# Patient Record
Sex: Female | Born: 1950 | Race: White | Hispanic: No | Smoking: Former smoker
Health system: Southern US, Community
[De-identification: ages and names within clinical notes are randomized; demographics above are authoritative.]

## PROBLEM LIST (undated history)

## (undated) DIAGNOSIS — F329 Major depressive disorder, single episode, unspecified: Secondary | ICD-10-CM

## (undated) DIAGNOSIS — B009 Herpesviral infection, unspecified: Secondary | ICD-10-CM

## (undated) DIAGNOSIS — I10 Essential (primary) hypertension: Secondary | ICD-10-CM

## (undated) DIAGNOSIS — G8929 Other chronic pain: Secondary | ICD-10-CM

## (undated) DIAGNOSIS — R29898 Other symptoms and signs involving the musculoskeletal system: Secondary | ICD-10-CM

## (undated) DIAGNOSIS — R51 Headache: Secondary | ICD-10-CM

## (undated) DIAGNOSIS — I6529 Occlusion and stenosis of unspecified carotid artery: Secondary | ICD-10-CM

## (undated) DIAGNOSIS — F32A Depression, unspecified: Secondary | ICD-10-CM

## (undated) DIAGNOSIS — E039 Hypothyroidism, unspecified: Secondary | ICD-10-CM

## (undated) DIAGNOSIS — M1990 Unspecified osteoarthritis, unspecified site: Secondary | ICD-10-CM

## (undated) DIAGNOSIS — G729 Myopathy, unspecified: Secondary | ICD-10-CM

## (undated) DIAGNOSIS — N200 Calculus of kidney: Secondary | ICD-10-CM

## (undated) HISTORY — DX: Headache: R51

## (undated) HISTORY — PX: APPENDECTOMY: SHX54

## (undated) HISTORY — DX: Occlusion and stenosis of unspecified carotid artery: I65.29

## (undated) HISTORY — PX: COLONOSCOPY: SHX174

## (undated) HISTORY — DX: Other chronic pain: G89.29

## (undated) HISTORY — PX: OTHER SURGICAL HISTORY: SHX169

## (undated) HISTORY — DX: Essential (primary) hypertension: I10

## (undated) HISTORY — PX: REFRACTIVE SURGERY: SHX103

## (undated) HISTORY — PX: TONSILLECTOMY AND ADENOIDECTOMY: SHX28

## (undated) HISTORY — DX: Other symptoms and signs involving the musculoskeletal system: R29.898

---

## 1972-03-15 DIAGNOSIS — N200 Calculus of kidney: Secondary | ICD-10-CM

## 1972-03-15 HISTORY — DX: Calculus of kidney: N20.0

## 1998-12-05 ENCOUNTER — Other Ambulatory Visit: Admission: RE | Admit: 1998-12-05 | Discharge: 1998-12-05 | Payer: Self-pay | Admitting: Obstetrics and Gynecology

## 1999-12-03 ENCOUNTER — Other Ambulatory Visit: Admission: RE | Admit: 1999-12-03 | Discharge: 1999-12-03 | Payer: Self-pay | Admitting: Obstetrics and Gynecology

## 1999-12-08 ENCOUNTER — Encounter: Payer: Self-pay | Admitting: Obstetrics and Gynecology

## 1999-12-08 ENCOUNTER — Encounter: Admission: RE | Admit: 1999-12-08 | Discharge: 1999-12-08 | Payer: Self-pay | Admitting: Obstetrics and Gynecology

## 2004-01-03 ENCOUNTER — Encounter: Admission: RE | Admit: 2004-01-03 | Discharge: 2004-01-03 | Payer: Self-pay | Admitting: *Deleted

## 2007-11-15 ENCOUNTER — Other Ambulatory Visit: Admission: RE | Admit: 2007-11-15 | Discharge: 2007-11-15 | Payer: Self-pay | Admitting: Family Medicine

## 2007-11-29 ENCOUNTER — Encounter: Admission: RE | Admit: 2007-11-29 | Discharge: 2007-11-29 | Payer: Self-pay | Admitting: Family Medicine

## 2008-12-02 ENCOUNTER — Encounter: Admission: RE | Admit: 2008-12-02 | Discharge: 2008-12-02 | Payer: Self-pay | Admitting: Family Medicine

## 2009-12-18 ENCOUNTER — Ambulatory Visit (HOSPITAL_BASED_OUTPATIENT_CLINIC_OR_DEPARTMENT_OTHER): Payer: PRIVATE HEALTH INSURANCE | Admitting: Oncology

## 2009-12-30 LAB — COMPREHENSIVE METABOLIC PANEL
Albumin: 4.5 g/dL (ref 3.5–5.2)
Alkaline Phosphatase: 79 U/L (ref 39–117)
BUN: 21 mg/dL (ref 6–23)
Calcium: 9.8 mg/dL (ref 8.4–10.5)
Chloride: 102 mEq/L (ref 96–112)
Glucose, Bld: 125 mg/dL — ABNORMAL HIGH (ref 70–99)
Potassium: 3.6 mEq/L (ref 3.5–5.3)
Sodium: 140 mEq/L (ref 135–145)
Total Protein: 6.9 g/dL (ref 6.0–8.3)

## 2009-12-30 LAB — CBC WITH DIFFERENTIAL/PLATELET
Basophils Absolute: 0 10*3/uL (ref 0.0–0.1)
Eosinophils Absolute: 0.2 10*3/uL (ref 0.0–0.5)
HGB: 13.9 g/dL (ref 11.6–15.9)
MCV: 95.8 fL (ref 79.5–101.0)
MONO#: 0.4 10*3/uL (ref 0.1–0.9)
MONO%: 8.3 % (ref 0.0–14.0)
NEUT#: 2.7 10*3/uL (ref 1.5–6.5)
RBC: 4.14 10*6/uL (ref 3.70–5.45)
RDW: 13.3 % (ref 11.2–14.5)
WBC: 5 10*3/uL (ref 3.9–10.3)
lymph#: 1.7 10*3/uL (ref 0.9–3.3)

## 2009-12-30 LAB — GAMMA GT: GGT: 40 U/L (ref 7–51)

## 2009-12-30 LAB — IRON AND TIBC
%SAT: 31 % (ref 20–55)
TIBC: 334 ug/dL (ref 250–470)

## 2010-01-22 ENCOUNTER — Encounter: Admission: RE | Admit: 2010-01-22 | Discharge: 2010-01-22 | Payer: Self-pay | Admitting: Family Medicine

## 2010-02-23 ENCOUNTER — Encounter
Admission: RE | Admit: 2010-02-23 | Discharge: 2010-02-23 | Payer: Self-pay | Source: Home / Self Care | Attending: Family Medicine | Admitting: Family Medicine

## 2010-05-05 ENCOUNTER — Encounter (HOSPITAL_BASED_OUTPATIENT_CLINIC_OR_DEPARTMENT_OTHER): Payer: PRIVATE HEALTH INSURANCE | Admitting: Oncology

## 2010-05-05 LAB — COMPREHENSIVE METABOLIC PANEL
Albumin: 4.6 g/dL (ref 3.5–5.2)
CO2: 31 mEq/L (ref 19–32)
Calcium: 10.2 mg/dL (ref 8.4–10.5)
Glucose, Bld: 116 mg/dL — ABNORMAL HIGH (ref 70–99)
Potassium: 3.4 mEq/L — ABNORMAL LOW (ref 3.5–5.3)
Sodium: 140 mEq/L (ref 135–145)
Total Protein: 6.7 g/dL (ref 6.0–8.3)

## 2010-05-05 LAB — CBC WITH DIFFERENTIAL/PLATELET
Basophils Absolute: 0 10*3/uL (ref 0.0–0.1)
Eosinophils Absolute: 0.3 10*3/uL (ref 0.0–0.5)
HCT: 43.5 % (ref 34.8–46.6)
HGB: 15.1 g/dL (ref 11.6–15.9)
MCV: 94.7 fL (ref 79.5–101.0)
NEUT#: 4.6 10*3/uL (ref 1.5–6.5)
NEUT%: 62.6 % (ref 38.4–76.8)
RDW: 14.1 % (ref 11.2–14.5)
lymph#: 1.9 10*3/uL (ref 0.9–3.3)

## 2010-05-05 LAB — IRON AND TIBC: Iron: 111 ug/dL (ref 42–145)

## 2010-05-05 LAB — FERRITIN: Ferritin: 342 ng/mL — ABNORMAL HIGH (ref 10–291)

## 2010-05-05 LAB — GAMMA GT: GGT: 48 U/L (ref 7–51)

## 2010-05-22 ENCOUNTER — Other Ambulatory Visit: Payer: Self-pay | Admitting: Family Medicine

## 2010-05-22 DIAGNOSIS — Z1231 Encounter for screening mammogram for malignant neoplasm of breast: Secondary | ICD-10-CM

## 2010-06-04 ENCOUNTER — Ambulatory Visit
Admission: RE | Admit: 2010-06-04 | Discharge: 2010-06-04 | Disposition: A | Discharge status: Home / Self Care | Payer: PRIVATE HEALTH INSURANCE | Source: Ambulatory Visit | Attending: Family Medicine | Admitting: Family Medicine

## 2010-06-04 DIAGNOSIS — Z1231 Encounter for screening mammogram for malignant neoplasm of breast: Secondary | ICD-10-CM

## 2010-09-10 ENCOUNTER — Ambulatory Visit: Payer: 59 | Attending: Family Medicine | Admitting: Physical Therapy

## 2010-09-10 DIAGNOSIS — IMO0001 Reserved for inherently not codable concepts without codable children: Secondary | ICD-10-CM | POA: Insufficient documentation

## 2010-09-10 DIAGNOSIS — M6281 Muscle weakness (generalized): Secondary | ICD-10-CM | POA: Insufficient documentation

## 2010-09-10 DIAGNOSIS — M2550 Pain in unspecified joint: Secondary | ICD-10-CM | POA: Insufficient documentation

## 2010-09-10 DIAGNOSIS — M2560 Stiffness of unspecified joint, not elsewhere classified: Secondary | ICD-10-CM | POA: Insufficient documentation

## 2010-09-14 ENCOUNTER — Ambulatory Visit: Payer: 59 | Attending: Family Medicine | Admitting: Physical Therapy

## 2010-09-14 DIAGNOSIS — M6281 Muscle weakness (generalized): Secondary | ICD-10-CM | POA: Insufficient documentation

## 2010-09-14 DIAGNOSIS — IMO0001 Reserved for inherently not codable concepts without codable children: Secondary | ICD-10-CM | POA: Insufficient documentation

## 2010-09-14 DIAGNOSIS — M2550 Pain in unspecified joint: Secondary | ICD-10-CM | POA: Insufficient documentation

## 2010-09-14 DIAGNOSIS — M2560 Stiffness of unspecified joint, not elsewhere classified: Secondary | ICD-10-CM | POA: Insufficient documentation

## 2010-09-17 ENCOUNTER — Ambulatory Visit: Payer: 59 | Admitting: Physical Therapy

## 2010-09-21 ENCOUNTER — Ambulatory Visit: Payer: 59 | Admitting: Physical Therapy

## 2010-09-23 ENCOUNTER — Ambulatory Visit: Payer: 59 | Admitting: Physical Therapy

## 2010-09-25 ENCOUNTER — Ambulatory Visit: Payer: 59 | Admitting: Physical Therapy

## 2010-09-28 ENCOUNTER — Ambulatory Visit: Payer: 59 | Admitting: Physical Therapy

## 2010-09-30 ENCOUNTER — Ambulatory Visit: Payer: 59 | Admitting: Physical Therapy

## 2010-10-02 ENCOUNTER — Ambulatory Visit: Payer: 59 | Admitting: Physical Therapy

## 2010-10-06 ENCOUNTER — Ambulatory Visit: Payer: 59 | Admitting: Physical Therapy

## 2010-10-08 ENCOUNTER — Encounter: Payer: PRIVATE HEALTH INSURANCE | Admitting: Physical Therapy

## 2010-10-09 ENCOUNTER — Ambulatory Visit: Payer: 59 | Admitting: Physical Therapy

## 2010-10-12 ENCOUNTER — Ambulatory Visit: Payer: 59 | Admitting: Physical Therapy

## 2010-10-14 ENCOUNTER — Ambulatory Visit: Payer: Medicare HMO | Attending: Family Medicine | Admitting: Physical Therapy

## 2010-10-14 DIAGNOSIS — M2560 Stiffness of unspecified joint, not elsewhere classified: Secondary | ICD-10-CM | POA: Insufficient documentation

## 2010-10-14 DIAGNOSIS — M2550 Pain in unspecified joint: Secondary | ICD-10-CM | POA: Insufficient documentation

## 2010-10-14 DIAGNOSIS — M6281 Muscle weakness (generalized): Secondary | ICD-10-CM | POA: Insufficient documentation

## 2010-10-14 DIAGNOSIS — IMO0001 Reserved for inherently not codable concepts without codable children: Secondary | ICD-10-CM | POA: Insufficient documentation

## 2010-10-16 ENCOUNTER — Encounter: Payer: PRIVATE HEALTH INSURANCE | Admitting: Physical Therapy

## 2010-10-19 ENCOUNTER — Ambulatory Visit: Payer: Medicare HMO | Admitting: Physical Therapy

## 2010-10-21 ENCOUNTER — Ambulatory Visit: Payer: Medicare HMO | Admitting: Physical Therapy

## 2010-10-23 ENCOUNTER — Encounter: Payer: PRIVATE HEALTH INSURANCE | Admitting: Physical Therapy

## 2010-10-26 ENCOUNTER — Ambulatory Visit: Payer: Medicare HMO | Admitting: Physical Therapy

## 2010-10-28 ENCOUNTER — Encounter: Payer: PRIVATE HEALTH INSURANCE | Admitting: Physical Therapy

## 2010-10-30 ENCOUNTER — Encounter: Payer: PRIVATE HEALTH INSURANCE | Admitting: Physical Therapy

## 2010-11-02 ENCOUNTER — Encounter: Payer: PRIVATE HEALTH INSURANCE | Admitting: Physical Therapy

## 2010-11-04 ENCOUNTER — Ambulatory Visit: Payer: Medicare HMO | Admitting: Physical Therapy

## 2010-11-06 ENCOUNTER — Encounter: Payer: PRIVATE HEALTH INSURANCE | Admitting: Physical Therapy

## 2010-12-31 ENCOUNTER — Encounter: Payer: Self-pay | Admitting: Gastroenterology

## 2011-01-05 ENCOUNTER — Ambulatory Visit (AMBULATORY_SURGERY_CENTER): Payer: PRIVATE HEALTH INSURANCE

## 2011-01-05 VITALS — Ht 67.0 in | Wt 174.3 lb

## 2011-01-05 DIAGNOSIS — Z1211 Encounter for screening for malignant neoplasm of colon: Secondary | ICD-10-CM

## 2011-01-05 MED ORDER — PEG-KCL-NACL-NASULF-NA ASC-C 100 G PO SOLR: 1.0000 | Freq: Once | ORAL | Status: AC

## 2011-01-05 NOTE — Progress Notes (Signed)
Pt came into the office today for her pre-visit prior to her colonoscopy with Dr Russella Dar on 01/19/11. Pt states she had a colonoscopy 10 years ago with a Dr Rogue Bussing in [place] (which was normal). A medical release form was filled out and was given to [facility]. Ulis Rias RN

## 2011-01-19 ENCOUNTER — Encounter: Payer: Self-pay | Admitting: Gastroenterology

## 2011-01-19 ENCOUNTER — Ambulatory Visit (AMBULATORY_SURGERY_CENTER): Payer: PRIVATE HEALTH INSURANCE | Admitting: Gastroenterology

## 2011-01-19 DIAGNOSIS — Z1211 Encounter for screening for malignant neoplasm of colon: Secondary | ICD-10-CM

## 2011-01-19 DIAGNOSIS — D126 Benign neoplasm of colon, unspecified: Secondary | ICD-10-CM

## 2011-01-19 MED ORDER — SODIUM CHLORIDE 0.9 % IV SOLN: 500.0000 mL | INTRAVENOUS | Status: DC

## 2011-01-19 NOTE — Patient Instructions (Signed)
Please refer to the blue and neon green sheets for instructions regarding diet and activity for the rest of today.  Handout on polyps given. Resume previous medications. 

## 2011-01-20 ENCOUNTER — Telehealth: Payer: Self-pay

## 2011-01-20 NOTE — Telephone Encounter (Signed)
Follow up Call- Patient questions:  Do you have a fever, pain , or abdominal swelling? no Pain Score  0 *  Have you tolerated food without any problems? no  Have you been able to return to your normal activities? yes  Do you have any questions about your discharge instructions: Diet   no Medications  no Follow up visit  no  Do you have questions or concerns about your Care? no  Actions: * If pain score is 4 or above: No action needed, pain <4.  Pt is on her way to work.  Pt asked how long it will be before she has a bowel movement.  I advised her it may take a few days.  She has to get back to eating.  Pt to call if has not had BM within 3 days. maw

## 2011-01-25 ENCOUNTER — Encounter: Payer: Self-pay | Admitting: Gastroenterology

## 2011-08-12 ENCOUNTER — Other Ambulatory Visit: Payer: Self-pay | Admitting: Family Medicine

## 2011-08-12 DIAGNOSIS — R7989 Other specified abnormal findings of blood chemistry: Secondary | ICD-10-CM

## 2011-08-16 ENCOUNTER — Other Ambulatory Visit: Payer: PRIVATE HEALTH INSURANCE

## 2011-08-17 ENCOUNTER — Ambulatory Visit
Admission: RE | Admit: 2011-08-17 | Discharge: 2011-08-17 | Disposition: A | Discharge status: Home / Self Care | Payer: PRIVATE HEALTH INSURANCE | Source: Ambulatory Visit | Attending: Family Medicine | Admitting: Family Medicine

## 2011-08-17 DIAGNOSIS — R7989 Other specified abnormal findings of blood chemistry: Secondary | ICD-10-CM

## 2011-08-26 ENCOUNTER — Encounter (HOSPITAL_COMMUNITY): Payer: Self-pay | Admitting: Dietician

## 2011-08-26 NOTE — Progress Notes (Signed)
[facility] Diabetes Class Completion  Date:August 26, 2011  Time: 6:30 PM  Pt attended [facility]'s Diabetes Class on August 26, 2011.   Patient was educated on the following topics: carbohydrate metabolism in relation to diabetes, sources of carbohydrate, carbohydrate counting, meal planning strategies, food label reading, and portion control.   Sadie Pickar A. Kayan, RD, LDN Date:August 26, 2011 Time: 6:30 PM 

## 2011-11-09 ENCOUNTER — Other Ambulatory Visit: Payer: Self-pay | Admitting: Family Medicine

## 2011-11-09 DIAGNOSIS — IMO0002 Reserved for concepts with insufficient information to code with codable children: Secondary | ICD-10-CM

## 2011-11-09 DIAGNOSIS — R29890 Loss of height: Secondary | ICD-10-CM

## 2011-11-11 ENCOUNTER — Ambulatory Visit
Admission: RE | Admit: 2011-11-11 | Discharge: 2011-11-11 | Disposition: A | Discharge status: Home / Self Care | Payer: PRIVATE HEALTH INSURANCE | Source: Ambulatory Visit | Attending: Family Medicine | Admitting: Family Medicine

## 2011-11-11 DIAGNOSIS — R29890 Loss of height: Secondary | ICD-10-CM

## 2011-11-11 DIAGNOSIS — IMO0002 Reserved for concepts with insufficient information to code with codable children: Secondary | ICD-10-CM

## 2012-01-10 ENCOUNTER — Other Ambulatory Visit: Payer: Self-pay

## 2012-04-24 ENCOUNTER — Ambulatory Visit
Admission: RE | Admit: 2012-04-24 | Discharge: 2012-04-24 | Disposition: A | Discharge status: Home / Self Care | Payer: PRIVATE HEALTH INSURANCE | Source: Ambulatory Visit | Attending: Family Medicine | Admitting: Family Medicine

## 2012-04-24 ENCOUNTER — Other Ambulatory Visit: Payer: Self-pay | Admitting: Family Medicine

## 2012-04-24 DIAGNOSIS — G8929 Other chronic pain: Secondary | ICD-10-CM

## 2012-04-24 DIAGNOSIS — Z139 Encounter for screening, unspecified: Secondary | ICD-10-CM

## 2012-04-24 DIAGNOSIS — M25552 Pain in left hip: Secondary | ICD-10-CM

## 2012-04-28 ENCOUNTER — Other Ambulatory Visit (INDEPENDENT_AMBULATORY_CARE_PROVIDER_SITE_OTHER): Payer: PRIVATE HEALTH INSURANCE | Admitting: *Deleted

## 2012-04-28 ENCOUNTER — Other Ambulatory Visit: Payer: PRIVATE HEALTH INSURANCE

## 2012-04-28 DIAGNOSIS — R0989 Other specified symptoms and signs involving the circulatory and respiratory systems: Secondary | ICD-10-CM

## 2012-05-01 ENCOUNTER — Ambulatory Visit
Admission: RE | Admit: 2012-05-01 | Discharge: 2012-05-01 | Disposition: A | Discharge status: Home / Self Care | Payer: PRIVATE HEALTH INSURANCE | Source: Ambulatory Visit | Attending: Family Medicine | Admitting: Family Medicine

## 2012-05-01 DIAGNOSIS — Z139 Encounter for screening, unspecified: Secondary | ICD-10-CM

## 2012-11-30 ENCOUNTER — Other Ambulatory Visit (INDEPENDENT_AMBULATORY_CARE_PROVIDER_SITE_OTHER): Payer: PRIVATE HEALTH INSURANCE | Admitting: *Deleted

## 2012-11-30 DIAGNOSIS — R0989 Other specified symptoms and signs involving the circulatory and respiratory systems: Secondary | ICD-10-CM

## 2013-01-18 ENCOUNTER — Other Ambulatory Visit: Payer: Self-pay

## 2013-04-12 ENCOUNTER — Other Ambulatory Visit: Payer: Self-pay

## 2013-05-09 ENCOUNTER — Other Ambulatory Visit: Payer: Self-pay | Admitting: Family Medicine

## 2013-05-09 DIAGNOSIS — Z09 Encounter for follow-up examination after completed treatment for conditions other than malignant neoplasm: Secondary | ICD-10-CM

## 2013-06-06 ENCOUNTER — Other Ambulatory Visit: Payer: PRIVATE HEALTH INSURANCE

## 2013-06-06 ENCOUNTER — Other Ambulatory Visit (HOSPITAL_COMMUNITY): Payer: PRIVATE HEALTH INSURANCE

## 2013-06-29 ENCOUNTER — Other Ambulatory Visit (HOSPITAL_COMMUNITY): Payer: Self-pay | Admitting: Family Medicine

## 2013-06-29 ENCOUNTER — Ambulatory Visit (HOSPITAL_COMMUNITY)
Admission: RE | Admit: 2013-06-29 | Discharge: 2013-06-29 | Disposition: A | Discharge status: Home / Self Care | Payer: 59 | Source: Ambulatory Visit | Attending: Vascular Surgery | Admitting: Vascular Surgery

## 2013-06-29 ENCOUNTER — Ambulatory Visit
Admission: RE | Admit: 2013-06-29 | Discharge: 2013-06-29 | Disposition: A | Discharge status: Home / Self Care | Payer: 59 | Source: Ambulatory Visit | Attending: Family Medicine | Admitting: Family Medicine

## 2013-06-29 DIAGNOSIS — R0989 Other specified symptoms and signs involving the circulatory and respiratory systems: Secondary | ICD-10-CM

## 2013-06-29 DIAGNOSIS — Z09 Encounter for follow-up examination after completed treatment for conditions other than malignant neoplasm: Secondary | ICD-10-CM

## 2013-06-29 DIAGNOSIS — I6529 Occlusion and stenosis of unspecified carotid artery: Secondary | ICD-10-CM

## 2013-07-02 ENCOUNTER — Encounter (HOSPITAL_COMMUNITY): Payer: Self-pay | Admitting: Pharmacy Technician

## 2013-07-02 ENCOUNTER — Encounter: Payer: Self-pay | Admitting: Surgery

## 2013-07-02 ENCOUNTER — Ambulatory Visit (INDEPENDENT_AMBULATORY_CARE_PROVIDER_SITE_OTHER): Payer: PRIVATE HEALTH INSURANCE | Admitting: Surgery

## 2013-07-02 ENCOUNTER — Other Ambulatory Visit: Payer: Self-pay

## 2013-07-02 VITALS — BP 132/76 | HR 65 | Ht 67.0 in | Wt 157.0 lb

## 2013-07-02 DIAGNOSIS — I6529 Occlusion and stenosis of unspecified carotid artery: Secondary | ICD-10-CM

## 2013-07-02 NOTE — Pre-Procedure Instructions (Signed)
Anne Warner  07/02/2013   Your procedure is scheduled on:  07/05/13  Report to [facility]  2 * 3 at 530 AM.  Call this number if you have problems the morning of surgery: [phone]   Remember:   Do not eat food or drink liquids after midnight.   Take these medicines the morning of surgery with A SIP OF WATER: xanax,hygroton,synthroid   Do not wear jewelry, make-up or nail polish.  Do not wear lotions, powders, or perfumes. You may wear deodorant.  Do not shave 48 hours prior to surgery. Men may shave face and neck.  Do not bring valuables to the hospital.  [facility] is not responsible                  for any belongings or valuables.               Contacts, dentures or bridgework may not be worn into surgery.  Leave suitcase in the car. After surgery it may be brought to your room.  For patients admitted to the hospital, discharge time is determined by your                treatment team.               Patients discharged the day of surgery will not be allowed to drive  home.  Name and phone number of your driver: family  Special Instructions: Shower using CHG 2 nights before surgery and the night before surgery.  If you shower the day of surgery use CHG.  Use special wash - you have one bottle of CHG for all showers.  You should use approximately 1/3 of the bottle for each shower.   Please read over the following fact sheets that you were given: Pain Booklet, Coughing and Deep Breathing, Blood Transfusion Information, MRSA Information and Surgical Site Infection Prevention

## 2013-07-02 NOTE — Progress Notes (Signed)
Patient name: Anne Warner MRN: [id number] DOB: 1950-09-15 Sex: female   Referred by: Dr. Sandi Mariscal  Reason for referral:  Chief Complaint  Patient presents with  . Carotid    new pt labs done on 06-29-13    HISTORY OF PRESENT ILLNESS: This is a very pleasant 63 year old female who has been followed for asymptomatic right carotid stenosis.  She denies numbness or weakness in either extremity.  She denies slurred speech.  She denies amaurosis fugax.  She recently had an episode of dizziness that was felt to be related to low blood pressure.  The patient has a history of having her filed or have a carotid endarterectomy in the setting of stroke which was complicated by death.  This was in 07-02-80.  Therefore, she is somewhat apprehensive.  The patient is medically managed for hypertension with 2 medications, including a ARB.  She has hypercholesterolemia but is not taking a statin secondary to muscle weakness.  She is a former smoker.  Past Medical History  Diagnosis Date  . Bilateral leg pain   . Thyroid disease   . Hypertension   . Chronic headaches   . Left leg weakness     Past Surgical History  Procedure Laterality Date  . Colonoscopy    . Appendectomy    . Tonsillectomy and adenoidectomy    . Broken arm       right arm/ with pins    History   Social History  . Marital Status: Divorced    Spouse Name: N/A    Number of Children: N/A  . Years of Education: N/A   Occupational History  . Not on file.   Social History Main Topics  . Smoking status: Former Smoker    Types: Cigarettes    Quit date: 03/16/2007  . Smokeless tobacco: Never Used     Comment: using e cigs  . Alcohol Use: 3.5 oz/week    7 drink(s) per week  . Drug Use: No  . Sexual Activity: Not on file   Other Topics Concern  . Not on file   Social History Narrative  . No narrative on file    Family History  Problem Relation Age of Onset  . Hypertension Mother   . Varicose Veins Mother   .  Diabetes Father   . Hyperlipidemia Father   . Diabetes Brother   . Hypertension Brother     Allergies as of 07/02/2013  . (No Known Allergies)    Current Outpatient Prescriptions on File Prior to Visit  Medication Sig Dispense Refill  . ALPRAZolam (XANAX) 0.5 MG tablet Take 0.5 mg by mouth daily. Take 1/2 tab daily       . celecoxib (CELEBREX) 200 MG capsule Take 200 mg by mouth daily.        . chlorthalidone (HYGROTON) 25 MG tablet Take 12.5 mg by mouth daily.       Marland Kitchen levothyroxine (SYNTHROID, LEVOTHROID) 88 MCG tablet Take 88 mcg by mouth daily.        . cyclobenzaprine (FLEXERIL) 5 MG tablet Take 5 mg by mouth 3 (three) times daily as needed.        . potassium chloride (KLOR-CON) 10 MEQ CR tablet Take 10 mEq by mouth daily. Take 2 tabs daily       . varenicline (CHANTIX) 1 MG tablet Take 1 mg by mouth 2 (two) times daily.         No current facility-administered medications on file prior to  visit.     REVIEW OF SYSTEMS: Cardiovascular: No chest pain, chest pressure, palpitations.  Positive for shortness of breath with exertion, leg pain and varicose vein Pulmonary: No productive cough, asthma or wheezing. Neurologic: No weakness, paresthesias, aphasia, or amaurosis. No dizziness. Hematologic: No bleeding problems or clotting disorders. Musculoskeletal: No joint pain or joint swelling. Gastrointestinal: No blood in stool or hematemesis Genitourinary: No dysuria or hematuria. Psychiatric:: No history of major depression. Integumentary: No rashes or ulcers. Constitutional: No fever or chills.  PHYSICAL EXAMINATION: General: The patient appears their stated age.  Vital signs are BP 132/76  Pulse 65  Ht 5\' 7"  (1.702 m)  Wt 157 lb (71.215 kg)  BMI 24.58 kg/m2  SpO2 100% HEENT:  No gross abnormalities Pulmonary: Respirations are non-labored Abdomen: Soft and non-tender  Musculoskeletal: There are no major deformities.   Neurologic: No focal weakness or paresthesias are  detected, Skin: There are no ulcer or rashes noted. Psychiatric: The patient has normal affect. Cardiovascular: There is a regular rate and rhythm without significant murmur appreciated.  No carotid bruits.  Diagnostic Studies: I have reviewed her carotid ultrasound.  This shows greater than 80% right internal carotid stenosis.  The left side is less than 40%.  Her bifurcation is normal.   Assessment:  Asymptomatic right carotid stenosis Plan: I discussed the risks and benefits of medical management vs. intervention, surgical or stenting.  We decided to proceed with right carotid endarterectomy.  I discussed the risks and benefits, including the risk of stroke, nerve injury, and recurrence.  She was I. to get this done as soon as possible.  I have placed her on the operating schedule for this Thursday, April 23.  All of her questions were answered today.     Eldridge Abrahams, M.D. [facility] Office: [phone] Pager:  [phone]

## 2013-07-03 ENCOUNTER — Inpatient Hospital Stay (HOSPITAL_COMMUNITY)
Admission: RE | Admit: 2013-07-03 | Discharge: 2013-07-03 | Disposition: A | Discharge status: Home / Self Care | Payer: 59 | Source: Ambulatory Visit

## 2013-07-04 ENCOUNTER — Encounter (HOSPITAL_COMMUNITY): Payer: Self-pay

## 2013-07-04 ENCOUNTER — Encounter (HOSPITAL_COMMUNITY)
Admission: RE | Admit: 2013-07-04 | Discharge: 2013-07-04 | Disposition: A | Discharge status: Home / Self Care | Payer: 59 | Source: Ambulatory Visit | Attending: Surgery | Admitting: Surgery

## 2013-07-04 HISTORY — DX: Herpesviral infection, unspecified: B00.9

## 2013-07-04 HISTORY — DX: Depression, unspecified: F32.A

## 2013-07-04 HISTORY — DX: Myopathy, unspecified: G72.9

## 2013-07-04 HISTORY — DX: Hypothyroidism, unspecified: E03.9

## 2013-07-04 HISTORY — DX: Major depressive disorder, single episode, unspecified: F32.9

## 2013-07-04 HISTORY — DX: Calculus of kidney: N20.0

## 2013-07-04 HISTORY — DX: Unspecified osteoarthritis, unspecified site: M19.90

## 2013-07-04 LAB — TYPE AND SCREEN
ABO/RH(D): A POS
ANTIBODY SCREEN: NEGATIVE

## 2013-07-04 LAB — COMPREHENSIVE METABOLIC PANEL
ALBUMIN: 4.1 g/dL (ref 3.5–5.2)
ALT: 55 U/L — ABNORMAL HIGH (ref 0–35)
AST: 38 U/L — ABNORMAL HIGH (ref 0–37)
Alkaline Phosphatase: 90 U/L (ref 39–117)
BUN: 16 mg/dL (ref 6–23)
CO2: 29 mEq/L (ref 19–32)
CREATININE: 0.79 mg/dL (ref 0.50–1.10)
Calcium: 10.1 mg/dL (ref 8.4–10.5)
Chloride: 99 mEq/L (ref 96–112)
GFR calc Af Amer: >90 mL/min (ref >90)
GFR calc non Af Amer: 87 mL/min — ABNORMAL LOW (ref >90)
Glucose, Bld: 117 mg/dL — ABNORMAL HIGH (ref 70–99)
Potassium: 4.1 mEq/L (ref 3.7–5.3)
Sodium: 141 mEq/L (ref 137–147)
TOTAL PROTEIN: 7.4 g/dL (ref 6.0–8.3)
Total Bilirubin: 0.4 mg/dL (ref 0.3–1.2)

## 2013-07-04 LAB — URINALYSIS, ROUTINE W REFLEX MICROSCOPIC
BILIRUBIN URINE: NEGATIVE
Glucose, UA: NEGATIVE mg/dL
HGB URINE DIPSTICK: NEGATIVE
Ketones, ur: NEGATIVE mg/dL
Leukocytes, UA: NEGATIVE
Nitrite: NEGATIVE
PH: 7 (ref 5.0–8.0)
Protein, ur: NEGATIVE mg/dL
Specific Gravity, Urine: 1.016 (ref 1.005–1.030)
UROBILINOGEN UA: 0.2 mg/dL (ref 0.0–1.0)

## 2013-07-04 LAB — CBC
HEMATOCRIT: 42.1 % (ref 36.0–46.0)
HEMOGLOBIN: 14.6 g/dL (ref 12.0–15.0)
MCH: 34.8 pg — AB (ref 26.0–34.0)
MCHC: 34.7 g/dL (ref 30.0–36.0)
MCV: 100.2 fL — ABNORMAL HIGH (ref 78.0–100.0)
Platelets: 177 10*3/uL (ref 150–400)
RBC: 4.2 MIL/uL (ref 3.87–5.11)
RDW: 12.6 % (ref 11.5–15.5)
WBC: 5.9 10*3/uL (ref 4.0–10.5)

## 2013-07-04 LAB — SURGICAL PCR SCREEN
MRSA, PCR: NEGATIVE
STAPHYLOCOCCUS AUREUS: POSITIVE — AB

## 2013-07-04 LAB — PROTIME-INR
INR: 0.85 (ref 0.00–1.49)
PROTHROMBIN TIME: 11.5 s — AB (ref 11.6–15.2)

## 2013-07-04 LAB — ABO/RH: ABO/RH(D): A POS

## 2013-07-04 LAB — APTT: aPTT: 24 seconds (ref 24–37)

## 2013-07-04 MED ORDER — DEXTROSE 5 % IV SOLN
1.5000 g | INTRAVENOUS | Status: AC
  Administered 2013-07-05: 1.5 g via INTRAVENOUS
  Filled 2013-07-04: qty 1.5

## 2013-07-04 MED ORDER — SODIUM CHLORIDE 0.9 % IV SOLN: INTRAVENOUS | Status: DC

## 2013-07-04 NOTE — Progress Notes (Signed)
Pt notified of surgery time change. Instructed pt to be here at 6:15 AM tomorrow. She voiced understanding.

## 2013-07-04 NOTE — Progress Notes (Signed)
Pt called back stating that she has Mupirocin ointment at home and wanted to know if she should start it tonight. I told her that she could and to use it in the am and bring it with her when she comes for surgery. She voiced understanding.

## 2013-07-04 NOTE — Progress Notes (Signed)
Anesthesia chart review: Patient is a 63 year old female scheduled for right carotid endarterectomy on 07/05/13 by Dr. Trula Slade.  History includes former smoker, hypertension, nephrolithiasis, hypothyroidism, depression, arthritis, chronic headaches, chronic dyspnea on exertion, bilateral lower extremity myopathy (not on statin therapy due to leg weakness), appendectomy,T&A. PCP is Dr. Derinda Late.  Of note, she told her PAT RN that she was anxious about surgery because her father died from CVA complications following carotid endarterectomy in 1982.  EKG on 07/04/13 showed NSR, possible LAE, septal infarct (age undetermined). Currently, there are no comparison EKGs available.  No chest pain symptoms reported at PAT.  Carotid duplex on 06/29/13 showed > 80% RICA stenosis, < 16% LICA stenosis.   CXR on 07/04/13 showed: No acute cardiopulmonary abnormality seen.  Chest CT on 06/29/13 (ordered by Dr. Sandi Mariscal) showed: 1. Lung-RADS Category 1, negative. Continue annual screening with low-dose chest CT without contrast in 12 months.  2. Sequela of old granulomatous disease redemonstrated, as above.  3. Hepatic steatosis.  4. 7 mm nonobstructive calculus in the upper pole collecting system of the left kidney incidentally noted.  Preoperative labs noted.  Cr 0.79, glucose 117, AST/ALT mildly elevated at 38/55.  H/H 14.6/42.1. UA WNL.  If no acute changes then I anticipate that she can proceed as planned.  George Hugh [facility] Short Stay Center/Anesthesiology Phone [phone] 07/04/2013 3:38 PM

## 2013-07-05 ENCOUNTER — Inpatient Hospital Stay (HOSPITAL_COMMUNITY)
Admission: RE | Admit: 2013-07-05 | Discharge: 2013-07-06 | DRG: 039 | Disposition: A | Discharge status: Home / Self Care | Payer: 59 | Source: Ambulatory Visit | Attending: Surgery | Admitting: Surgery

## 2013-07-05 ENCOUNTER — Inpatient Hospital Stay (HOSPITAL_COMMUNITY): Payer: 59 | Admitting: Certified Registered"

## 2013-07-05 ENCOUNTER — Encounter (HOSPITAL_COMMUNITY)
Admission: RE | Disposition: A | Discharge status: Home / Self Care | Payer: PRIVATE HEALTH INSURANCE | Source: Ambulatory Visit | Attending: Surgery

## 2013-07-05 ENCOUNTER — Encounter (HOSPITAL_COMMUNITY): Payer: Self-pay | Admitting: *Deleted

## 2013-07-05 ENCOUNTER — Encounter (HOSPITAL_COMMUNITY): Payer: 59 | Admitting: Vascular Surgery

## 2013-07-05 DIAGNOSIS — I10 Essential (primary) hypertension: Secondary | ICD-10-CM | POA: Diagnosis present

## 2013-07-05 DIAGNOSIS — E7800 Pure hypercholesterolemia, unspecified: Secondary | ICD-10-CM | POA: Diagnosis present

## 2013-07-05 DIAGNOSIS — I6529 Occlusion and stenosis of unspecified carotid artery: Secondary | ICD-10-CM | POA: Diagnosis present

## 2013-07-05 DIAGNOSIS — Z01812 Encounter for preprocedural laboratory examination: Secondary | ICD-10-CM

## 2013-07-05 DIAGNOSIS — Z01818 Encounter for other preprocedural examination: Secondary | ICD-10-CM

## 2013-07-05 DIAGNOSIS — Z87891 Personal history of nicotine dependence: Secondary | ICD-10-CM

## 2013-07-05 DIAGNOSIS — E039 Hypothyroidism, unspecified: Secondary | ICD-10-CM | POA: Diagnosis present

## 2013-07-05 DIAGNOSIS — I658 Occlusion and stenosis of other precerebral arteries: Secondary | ICD-10-CM | POA: Diagnosis present

## 2013-07-05 DIAGNOSIS — Z01810 Encounter for preprocedural cardiovascular examination: Secondary | ICD-10-CM

## 2013-07-05 DIAGNOSIS — Z79899 Other long term (current) drug therapy: Secondary | ICD-10-CM

## 2013-07-05 HISTORY — PX: ENDARTERECTOMY: SHX5162

## 2013-07-05 LAB — CBC
HCT: 39.5 % (ref 36.0–46.0)
Hemoglobin: 13.5 g/dL (ref 12.0–15.0)
MCH: 34.4 pg — ABNORMAL HIGH (ref 26.0–34.0)
MCHC: 34.2 g/dL (ref 30.0–36.0)
MCV: 100.5 fL — AB (ref 78.0–100.0)
Platelets: 151 10*3/uL (ref 150–400)
RBC: 3.93 MIL/uL (ref 3.87–5.11)
RDW: 12.8 % (ref 11.5–15.5)
WBC: 8.5 10*3/uL (ref 4.0–10.5)

## 2013-07-05 LAB — CREATININE, SERUM
Creatinine, Ser: 0.69 mg/dL (ref 0.50–1.10)
GFR calc non Af Amer: >90 mL/min (ref >90)

## 2013-07-05 SURGERY — ENDARTERECTOMY, CAROTID
Anesthesia: General | Site: Neck | Laterality: Right

## 2013-07-05 MED ORDER — GLYCOPYRROLATE 0.2 MG/ML IJ SOLN
INTRAMUSCULAR | Status: AC
  Filled 2013-07-05: qty 4

## 2013-07-05 MED ORDER — HYDRALAZINE HCL 20 MG/ML IJ SOLN
INTRAMUSCULAR | Status: DC | PRN
  Administered 2013-07-05: 5 mg via INTRAVENOUS

## 2013-07-05 MED ORDER — PROTAMINE SULFATE 10 MG/ML IV SOLN
INTRAVENOUS | Status: DC | PRN
  Administered 2013-07-05 (×3): 10 mg via INTRAVENOUS

## 2013-07-05 MED ORDER — LIDOCAINE HCL (CARDIAC) 20 MG/ML IV SOLN
INTRAVENOUS | Status: DC | PRN
  Administered 2013-07-05: 100 mg via INTRAVENOUS

## 2013-07-05 MED ORDER — BISACODYL 5 MG PO TBEC
5.0000 mg | DELAYED_RELEASE_TABLET | Freq: Every day | ORAL | Status: DC | PRN
Start: 2013-07-05 — End: 2013-07-06

## 2013-07-05 MED ORDER — PANTOPRAZOLE SODIUM 40 MG PO TBEC
40.0000 mg | DELAYED_RELEASE_TABLET | Freq: Every day | ORAL | Status: DC
  Administered 2013-07-05 – 2013-07-06 (×2): 40 mg via ORAL
  Filled 2013-07-05 (×2): qty 1

## 2013-07-05 MED ORDER — ONDANSETRON HCL 4 MG/2ML IJ SOLN
4.0000 mg | Freq: Four times a day (QID) | INTRAMUSCULAR | Status: DC | PRN
  Administered 2013-07-05 (×2): 4 mg via INTRAVENOUS
  Filled 2013-07-05 (×2): qty 2

## 2013-07-05 MED ORDER — NEOSTIGMINE METHYLSULFATE 1 MG/ML IJ SOLN
INTRAMUSCULAR | Status: DC | PRN
  Administered 2013-07-05: 3 mg via INTRAVENOUS

## 2013-07-05 MED ORDER — LEVOTHYROXINE SODIUM 75 MCG PO TABS
75.0000 ug | ORAL_TABLET | Freq: Every day | ORAL | Status: DC
  Administered 2013-07-06: 75 ug via ORAL
  Filled 2013-07-05 (×2): qty 1

## 2013-07-05 MED ORDER — ACETAMINOPHEN 325 MG RE SUPP
325.0000 mg | RECTAL | Status: DC | PRN
  Filled 2013-07-05: qty 2

## 2013-07-05 MED ORDER — HYDRALAZINE HCL 20 MG/ML IJ SOLN: 10.0000 mg | INTRAMUSCULAR | Status: DC | PRN

## 2013-07-05 MED ORDER — ALPRAZOLAM 0.5 MG PO TABS
0.5000 mg | ORAL_TABLET | Freq: Every day | ORAL | Status: DC
  Administered 2013-07-05: 0.5 mg via ORAL
  Filled 2013-07-05: qty 1

## 2013-07-05 MED ORDER — FENTANYL CITRATE 0.05 MG/ML IJ SOLN
INTRAMUSCULAR | Status: AC
  Filled 2013-07-05: qty 5

## 2013-07-05 MED ORDER — CHLORTHALIDONE 25 MG PO TABS
12.5000 mg | ORAL_TABLET | Freq: Every day | ORAL | Status: DC
  Administered 2013-07-05 – 2013-07-06 (×2): 12.5 mg via ORAL
  Filled 2013-07-05 (×2): qty 0.5

## 2013-07-05 MED ORDER — DOCUSATE SODIUM 100 MG PO CAPS
100.0000 mg | ORAL_CAPSULE | Freq: Every day | ORAL | Status: DC
  Administered 2013-07-06: 100 mg via ORAL
  Filled 2013-07-05: qty 1

## 2013-07-05 MED ORDER — OXYCODONE-ACETAMINOPHEN 5-325 MG PO TABS
1.0000 | ORAL_TABLET | ORAL | Status: DC | PRN
  Administered 2013-07-05 – 2013-07-06 (×3): 2 via ORAL
  Administered 2013-07-06: 1 via ORAL
  Filled 2013-07-05 (×3): qty 2
  Filled 2013-07-05: qty 1

## 2013-07-05 MED ORDER — PROPOFOL 10 MG/ML IV BOLUS
INTRAVENOUS | Status: AC
  Filled 2013-07-05: qty 20

## 2013-07-05 MED ORDER — GLYCOPYRROLATE 0.2 MG/ML IJ SOLN
INTRAMUSCULAR | Status: AC
  Filled 2013-07-05: qty 1

## 2013-07-05 MED ORDER — LIDOCAINE HCL (PF) 1 % IJ SOLN
INTRAMUSCULAR | Status: AC
  Filled 2013-07-05: qty 30

## 2013-07-05 MED ORDER — PROMETHAZINE HCL 25 MG/ML IJ SOLN: 6.2500 mg | INTRAMUSCULAR | Status: DC | PRN

## 2013-07-05 MED ORDER — ASPIRIN 81 MG PO CHEW
81.0000 mg | CHEWABLE_TABLET | Freq: Every day | ORAL | Status: DC
Start: 2013-07-05 — End: 2013-07-06
  Administered 2013-07-05 – 2013-07-06 (×2): 81 mg via ORAL
  Filled 2013-07-05 (×2): qty 1

## 2013-07-05 MED ORDER — LOSARTAN POTASSIUM 50 MG PO TABS
50.0000 mg | ORAL_TABLET | Freq: Every day | ORAL | Status: DC
  Administered 2013-07-05 – 2013-07-06 (×2): 50 mg via ORAL
  Filled 2013-07-05 (×2): qty 1

## 2013-07-05 MED ORDER — FENTANYL CITRATE 0.05 MG/ML IJ SOLN
INTRAMUSCULAR | Status: DC | PRN
  Administered 2013-07-05: 50 ug via INTRAVENOUS
  Administered 2013-07-05 (×2): 100 ug via INTRAVENOUS

## 2013-07-05 MED ORDER — POTASSIUM CHLORIDE CRYS ER 20 MEQ PO TBCR: 20.0000 meq | EXTENDED_RELEASE_TABLET | Freq: Every day | ORAL | Status: DC | PRN

## 2013-07-05 MED ORDER — HYDROMORPHONE HCL PF 1 MG/ML IJ SOLN
0.2500 mg | INTRAMUSCULAR | Status: DC | PRN
  Administered 2013-07-05: 0.5 mg via INTRAVENOUS
  Administered 2013-07-05 (×2): 0.25 mg via INTRAVENOUS
  Administered 2013-07-05 (×2): 0.5 mg via INTRAVENOUS

## 2013-07-05 MED ORDER — HEPARIN SODIUM (PORCINE) 1000 UNIT/ML IJ SOLN
INTRAMUSCULAR | Status: DC | PRN
  Administered 2013-07-05: 7000 [IU] via INTRAVENOUS

## 2013-07-05 MED ORDER — MUPIROCIN 2 % EX OINT: 1.0000 "application " | TOPICAL_OINTMENT | Freq: Once | CUTANEOUS | Status: AC | PRN

## 2013-07-05 MED ORDER — ONDANSETRON HCL 4 MG/2ML IJ SOLN
INTRAMUSCULAR | Status: DC | PRN
  Administered 2013-07-05: 4 mg via INTRAVENOUS

## 2013-07-05 MED ORDER — HYDROMORPHONE HCL PF 1 MG/ML IJ SOLN
INTRAMUSCULAR | Status: AC
  Filled 2013-07-05: qty 1

## 2013-07-05 MED ORDER — POLYETHYLENE GLYCOL 3350 17 G PO PACK
17.0000 g | PACK | Freq: Every day | ORAL | Status: DC | PRN
  Filled 2013-07-05: qty 1

## 2013-07-05 MED ORDER — ONDANSETRON HCL 4 MG/2ML IJ SOLN
INTRAMUSCULAR | Status: AC
  Filled 2013-07-05: qty 2

## 2013-07-05 MED ORDER — OXYCODONE HCL 5 MG/5ML PO SOLN: 5.0000 mg | Freq: Once | ORAL | Status: DC | PRN

## 2013-07-05 MED ORDER — ACETAMINOPHEN 325 MG PO TABS: 325.0000 mg | ORAL_TABLET | ORAL | Status: DC | PRN

## 2013-07-05 MED ORDER — ENOXAPARIN SODIUM 40 MG/0.4ML ~~LOC~~ SOLN
40.0000 mg | SUBCUTANEOUS | Status: DC
  Administered 2013-07-06: 40 mg via SUBCUTANEOUS
  Filled 2013-07-05 (×2): qty 0.4

## 2013-07-05 MED ORDER — LACTATED RINGERS IV SOLN
INTRAVENOUS | Status: DC | PRN
  Administered 2013-07-05 (×2): via INTRAVENOUS

## 2013-07-05 MED ORDER — METOPROLOL TARTRATE 1 MG/ML IV SOLN: 2.0000 mg | INTRAVENOUS | Status: DC | PRN

## 2013-07-05 MED ORDER — ALUM & MAG HYDROXIDE-SIMETH 200-200-20 MG/5ML PO SUSP: 15.0000 mL | ORAL | Status: DC | PRN

## 2013-07-05 MED ORDER — 0.9 % SODIUM CHLORIDE (POUR BTL) OPTIME
TOPICAL | Status: DC | PRN
  Administered 2013-07-05: 2000 mL

## 2013-07-05 MED ORDER — PHENOL 1.4 % MT LIQD: 1.0000 | OROMUCOSAL | Status: DC | PRN

## 2013-07-05 MED ORDER — SODIUM CHLORIDE 0.9 % IV SOLN: 500.0000 mL | Freq: Once | INTRAVENOUS | Status: AC | PRN

## 2013-07-05 MED ORDER — SODIUM CHLORIDE 0.9 % IR SOLN
Status: DC | PRN
  Administered 2013-07-05: 08:00:00

## 2013-07-05 MED ORDER — ARTIFICIAL TEARS OP OINT
TOPICAL_OINTMENT | OPHTHALMIC | Status: AC
  Filled 2013-07-05: qty 3.5

## 2013-07-05 MED ORDER — DEXTROSE 5 % IV SOLN
1.5000 g | Freq: Two times a day (BID) | INTRAVENOUS | Status: AC
  Administered 2013-07-05 – 2013-07-06 (×2): 1.5 g via INTRAVENOUS
  Filled 2013-07-05 (×2): qty 1.5

## 2013-07-05 MED ORDER — PROTAMINE SULFATE 10 MG/ML IV SOLN
INTRAVENOUS | Status: AC
  Filled 2013-07-05: qty 5

## 2013-07-05 MED ORDER — VECURONIUM BROMIDE 10 MG IV SOLR
INTRAVENOUS | Status: DC | PRN
  Administered 2013-07-05: 3 mg via INTRAVENOUS

## 2013-07-05 MED ORDER — MORPHINE SULFATE 2 MG/ML IJ SOLN
2.0000 mg | INTRAMUSCULAR | Status: DC | PRN
  Administered 2013-07-06: 2 mg via INTRAVENOUS
  Filled 2013-07-05: qty 1

## 2013-07-05 MED ORDER — CHLORHEXIDINE GLUCONATE 4 % EX LIQD
60.0000 mL | Freq: Once | CUTANEOUS | Status: DC
  Filled 2013-07-05: qty 60

## 2013-07-05 MED ORDER — NEOSTIGMINE METHYLSULFATE 1 MG/ML IJ SOLN
INTRAMUSCULAR | Status: AC
Start: 2013-07-05 — End: 2013-07-05
  Filled 2013-07-05: qty 10

## 2013-07-05 MED ORDER — HEMOSTATIC AGENTS (NO CHARGE) OPTIME
TOPICAL | Status: DC | PRN
  Administered 2013-07-05: 1 via TOPICAL

## 2013-07-05 MED ORDER — OXYCODONE-ACETAMINOPHEN 5-325 MG PO TABS
ORAL_TABLET | ORAL | Status: AC
  Administered 2013-07-05: 2
  Filled 2013-07-05: qty 2

## 2013-07-05 MED ORDER — SODIUM CHLORIDE 0.9 % IV SOLN: INTRAVENOUS | Status: DC

## 2013-07-05 MED ORDER — MAGNESIUM SULFATE 40 MG/ML IJ SOLN: 2.0000 g | Freq: Every day | INTRAMUSCULAR | Status: DC | PRN

## 2013-07-05 MED ORDER — LABETALOL HCL 5 MG/ML IV SOLN: 10.0000 mg | INTRAVENOUS | Status: DC | PRN

## 2013-07-05 MED ORDER — ASPIRIN 81 MG PO TABS: 81.0000 mg | ORAL_TABLET | Freq: Every day | ORAL | Status: DC

## 2013-07-05 MED ORDER — GUAIFENESIN-DM 100-10 MG/5ML PO SYRP: 15.0000 mL | ORAL_SOLUTION | ORAL | Status: DC | PRN

## 2013-07-05 MED ORDER — OXYCODONE HCL 5 MG PO TABS: 5.0000 mg | ORAL_TABLET | Freq: Once | ORAL | Status: DC | PRN

## 2013-07-05 MED ORDER — ROCURONIUM BROMIDE 100 MG/10ML IV SOLN
INTRAVENOUS | Status: DC | PRN
  Administered 2013-07-05: 250 mg via INTRAVENOUS

## 2013-07-05 MED ORDER — PROPOFOL 10 MG/ML IV BOLUS
INTRAVENOUS | Status: DC | PRN
  Administered 2013-07-05: 150 mg via INTRAVENOUS
  Administered 2013-07-05: 50 mg via INTRAVENOUS

## 2013-07-05 MED ORDER — DEXTROSE 5 % IV SOLN
1.5000 g | Freq: Two times a day (BID) | INTRAVENOUS | Status: DC
  Filled 2013-07-05: qty 1.5

## 2013-07-05 MED ORDER — SODIUM CHLORIDE 0.9 % IV SOLN
10.0000 mg | INTRAVENOUS | Status: DC | PRN
  Administered 2013-07-05: 15 ug/min via INTRAVENOUS

## 2013-07-05 MED ORDER — GLYCOPYRROLATE 0.2 MG/ML IJ SOLN
INTRAMUSCULAR | Status: DC | PRN
  Administered 2013-07-05: .6 mg via INTRAVENOUS
  Administered 2013-07-05: 0.2 mg via INTRAVENOUS

## 2013-07-05 SURGICAL SUPPLY — 54 items
ADH SKN CLS APL DERMABOND .7 (GAUZE/BANDAGES/DRESSINGS) ×1
BLADE 10 SAFETY STRL DISP (BLADE) ×1 IMPLANT
CANISTER SUCTION 2500CC (MISCELLANEOUS) ×2 IMPLANT
CATH ROBINSON RED A/P 18FR (CATHETERS) ×2 IMPLANT
CATH SUCT 10FR WHISTLE TIP (CATHETERS) ×2 IMPLANT
CLIP TI MEDIUM 6 (CLIP) ×2 IMPLANT
CLIP TI WIDE RED SMALL 6 (CLIP) ×3 IMPLANT
COVER SURGICAL LIGHT HANDLE (MISCELLANEOUS) ×2 IMPLANT
CRADLE DONUT ADULT HEAD (MISCELLANEOUS) ×2 IMPLANT
DERMABOND ADVANCED (GAUZE/BANDAGES/DRESSINGS) ×1
DERMABOND ADVANCED .7 DNX12 (GAUZE/BANDAGES/DRESSINGS) ×1 IMPLANT
DRAIN CHANNEL 15F RND FF W/TCR (WOUND CARE) IMPLANT
DRAPE PROXIMA HALF (DRAPES) ×1 IMPLANT
DRAPE WARM FLUID 44X44 (DRAPE) ×2 IMPLANT
ELECT REM PT RETURN 9FT ADLT (ELECTROSURGICAL) ×2
ELECTRODE REM PT RTRN 9FT ADLT (ELECTROSURGICAL) ×1 IMPLANT
EVACUATOR SILICONE 100CC (DRAIN) IMPLANT
GLOVE BIOGEL PI IND STRL 7.0 (GLOVE) IMPLANT
GLOVE BIOGEL PI IND STRL 7.5 (GLOVE) ×1 IMPLANT
GLOVE BIOGEL PI IND STRL 8 (GLOVE) IMPLANT
GLOVE BIOGEL PI INDICATOR 7.0 (GLOVE) ×2
GLOVE BIOGEL PI INDICATOR 7.5 (GLOVE) ×3
GLOVE BIOGEL PI INDICATOR 8 (GLOVE) ×1
GLOVE ECLIPSE 6.5 STRL STRAW (GLOVE) ×5 IMPLANT
GLOVE SS BIOGEL STRL SZ 7 (GLOVE) IMPLANT
GLOVE SUPERSENSE BIOGEL SZ 7 (GLOVE) ×2
GLOVE SURG SS PI 7.5 STRL IVOR (GLOVE) ×2 IMPLANT
GOWN STRL REUS W/ TWL LRG LVL3 (GOWN DISPOSABLE) ×2 IMPLANT
GOWN STRL REUS W/ TWL XL LVL3 (GOWN DISPOSABLE) ×1 IMPLANT
GOWN STRL REUS W/TWL LRG LVL3 (GOWN DISPOSABLE) ×4
GOWN STRL REUS W/TWL XL LVL3 (GOWN DISPOSABLE) ×6
HEMOSTAT SNOW SURGICEL 2X4 (HEMOSTASIS) ×1 IMPLANT
INSERT FOGARTY SM (MISCELLANEOUS) IMPLANT
KIT BASIN OR (CUSTOM PROCEDURE TRAY) ×2 IMPLANT
KIT ROOM TURNOVER OR (KITS) ×2 IMPLANT
NS IRRIG 1000ML POUR BTL (IV SOLUTION) ×6 IMPLANT
PACK CAROTID (CUSTOM PROCEDURE TRAY) ×2 IMPLANT
PAD ARMBOARD 7.5X6 YLW CONV (MISCELLANEOUS) ×4 IMPLANT
PATCH VASCULAR VASCU GUARD 1X6 (Vascular Products) ×1 IMPLANT
SHUNT CAROTID BYPASS 10 (VASCULAR PRODUCTS) IMPLANT
SHUNT CAROTID BYPASS 12FRX15.5 (VASCULAR PRODUCTS) IMPLANT
SPONGE INTESTINAL PEANUT (DISPOSABLE) ×2 IMPLANT
SUT ETHILON 3 0 PS 1 (SUTURE) IMPLANT
SUT PROLENE 6 0 BV (SUTURE) ×4 IMPLANT
SUT PROLENE 7 0 BV 1 (SUTURE) IMPLANT
SUT PROLENE 7 0 BV1 MDA (SUTURE) ×1 IMPLANT
SUT SILK 3 0 TIES 17X18 (SUTURE)
SUT SILK 3-0 18XBRD TIE BLK (SUTURE) IMPLANT
SUT VIC AB 3-0 SH 27 (SUTURE) ×6
SUT VIC AB 3-0 SH 27X BRD (SUTURE) ×2 IMPLANT
SUT VICRYL 4-0 PS2 18IN ABS (SUTURE) ×2 IMPLANT
TOWEL OR 17X24 6PK STRL BLUE (TOWEL DISPOSABLE) ×2 IMPLANT
TOWEL OR 17X26 10 PK STRL BLUE (TOWEL DISPOSABLE) ×2 IMPLANT
WATER STERILE IRR 1000ML POUR (IV SOLUTION) ×2 IMPLANT

## 2013-07-05 NOTE — Anesthesia Preprocedure Evaluation (Addendum)
Anesthesia Evaluation  Patient identified by MRN, date of birth, ID band Patient awake    Airway Mallampati: I      Dental  (+) Teeth Intact, Dental Advidsory Given, Caps   Pulmonary shortness of breath, former smoker,  breath sounds clear to auscultation        Cardiovascular hypertension, Rhythm:Regular Rate:Normal     Neuro/Psych  Headaches, PSYCHIATRIC DISORDERS  Neuromuscular disease    GI/Hepatic   Endo/Other  Hypothyroidism   Renal/GU      Musculoskeletal   Abdominal   Peds  Hematology   Anesthesia Other Findings   Reproductive/Obstetrics                          Anesthesia Physical Anesthesia Plan  ASA: III  Anesthesia Plan: General   Post-op Pain Management:    Induction: Intravenous  Airway Management Planned: Oral ETT  Additional Equipment: Arterial line  Intra-op Plan:   Post-operative Plan: Extubation in OR  Informed Consent: I have reviewed the patients History and Physical, chart, labs and discussed the procedure including the risks, benefits and alternatives for the proposed anesthesia with the patient or authorized representative who has indicated his/her understanding and acceptance.   Dental Advisory Given  Plan Discussed with: CRNA, Surgeon and Anesthesiologist  Anesthesia Plan Comments:        Anesthesia Quick Evaluation

## 2013-07-05 NOTE — Progress Notes (Signed)
Utilization review completed.  

## 2013-07-05 NOTE — Op Note (Signed)
Patient name: Anne Warner MRN: [id number] DOB: January 15, 1951 Sex: female  63/23/2015 Pre-operative Diagnosis: Asymptomatic   right carotid stenosis Post-operative diagnosis:  Same Surgeon:  [name] Assistants:  J.D. Kellie Simmering, [place]. Collins Procedure:    right carotid Endarterectomy with bovine pericardial  patch angioplasty Anesthesia:  General Blood Loss:  See anesthesia record Specimens:  Carotid Plaque to pathology  Findings:  90 %stenosis; Thrombus:  none  Indications:  63 yo followed for asymptomatic right carotid stenosis which has now progressed to > 80%.  Procedure:  The patient was identified in the holding area and taken to [facility]  The patient was then placed supine on the table.   General endotrachial anesthesia was administered.  The patient was prepped and draped in the usual sterile fashion.  A time out was called and antibiotics were administered.  The incision was made along the anterior border of the right sternocleidomastoid muscle.  Cautery was used to dissect through the subcutaneous tissue.  The platysma muscle was divided with cautery.  The internal jugular vein was exposed along its anterior medial border.  The common facial vein was exposed and then divided between 2-0 silk ties and metal clips.  The common carotid artery was then circumferentially exposed and encircled with an umbilical tape.  The vagus nerve was identified and protected.  Next sharp dissection was used to expose the external carotid artery and the superior thyroid artery.  The were encircled with a blue vessel loop and a 2-0 silk tie respectively.  Finally, the internal carotid was carefully dissected free.  An umbilical tape was placed around the internal carotid artery distal to the diseased segment.  The hypoglossal nerve was visualized throughout and protected.  The patient was given systemic heparinization.  A bovine carotid patch was selected and prepared on the back table.  A 10 french shunt  was also prepared.  After blood pressure readings were appropriate and the heparin had been given time to circulate, the internal carotid artery was occluded with a baby Gregory clamp.  The external and common carotid arteries were then occluded with vascular clamps and the 2-0 tie tightened on the superior thyroid artery.  A #11 blade was used to make an arteriotomy in the common carotid artery.  This was extended with Potts scissors along the anterior and lateral border of the common and internal carotid artery.  Approximately 90% stenosis was identified.  There was no thrombus identified.  The 10 french shunt was not placed because she had pulsatile backbleeding.  A kleiner kuntz elevator was used to perform endarterectomy.  An eversion endarterectomy was performed in the external carotid artery.  A good distal endpoint was obtained in the internal carotid artery.  The specimen was removed and sent to pathology.  Heparinized saline was used to irrigate the endarterectomized field.  All potential embolic debris was removed.  Bovine pericardial patch angioplasty was then performed using a running 6-0 Prolene. The common internal and external carotid arteries were all appropriately flushed. The artery was again irrigated with heparin saline.  The anastomosis was then secured. The clamp was first released on the external carotid artery followed by the common carotid artery approximately 30 seconds later, bloodflow was reestablish through the internal carotid artery.  Next, a hand-held  Doppler was used to evaluate the signals in the common, external, and internal  carotid arteries, all of which had appropriate signals. I then administered  50 mg protamine. The wound was then  irrigated.  After hemostasis was achieved, the carotid sheath was reapproximated with 3-0 Vicryl. The  platysma muscle was reapproximated with running 3-0 Vicryl. The skin  was closed with 4-0 Vicryl. Dermabond was placed on the skin. The    patient was then successfully extubated. Her neurologic exam was  similar to his preprocedural exam. The patient was then taken to recovery room  in stable condition. There were no complications.     Disposition:  To PACU in stable condition.  Relevant Operative Details:  Normal anatomy and location of bifurcation.  I did not use a shunt,as there was pulsatile backbleeding.  Theotis Burrow, M.D. [facility] Office: [phone] Pager:  [phone]

## 2013-07-05 NOTE — Progress Notes (Signed)
S/p right CEA  Fully awake and moving without neurologic deficit Responds to commands appropriately Slight marginal mandibular neuropraxia Incision intact and soft  Wells Rosser [place]

## 2013-07-05 NOTE — Progress Notes (Signed)
ANTIBIOTIC CONSULT NOTE - INITIAL  Pharmacy Consult for antibiotics renal dose adjustment Indication: Surgical prophylaxis  No Known Allergies  Patient Measurements: Height: 5\' 7"  (170.2 cm) Weight: 157 lb (71.215 kg) IBW/kg (Calculated) : 61.6  Vital Signs: Temp: 98.6 F (37 C) (04/23 1049) Temp src: Oral (04/23 0633) BP: 149/72 mmHg (04/23 1100) Pulse Rate: 70 (04/23 1345) Intake/Output from previous day:   Intake/Output from this shift: Total I/O In: 1500 [I.V.:1500] Out: 150 [Blood:150]  Labs:  Recent Labs  07/04/13 1341  WBC 5.9  HGB 14.6  PLT 177  CREATININE 0.79   Estimated Creatinine Clearance: 70.9 ml/min (by C-G formula based on Cr of 0.79). No results found for this basename: VANCOTROUGH, Corlis Leak, VANCORANDOM, GENTTROUGH, GENTPEAK, GENTRANDOM, TOBRATROUGH, TOBRAPEAK, TOBRARND, AMIKACINPEAK, AMIKACINTROU, AMIKACIN,  in the last 72 hours   Microbiology: Recent Results (from the past 720 hour(s))  SURGICAL PCR SCREEN     Status: Abnormal   Collection Time    07/04/13  1:40 PM      Result Value Ref Range Status   MRSA, PCR NEGATIVE  NEGATIVE Final   Staphylococcus aureus POSITIVE (*) NEGATIVE Final   Comment:            The Xpert SA Assay (FDA     approved for NASAL specimens     in patients over 2 years of age),     is one component of     a comprehensive surveillance     program.  Test performance has     been validated by [employer] for patients greater     than or equal to 30 year old.     It is not intended     to diagnose infection nor to     guide or monitor treatment.    Medical History: Past Medical History  Diagnosis Date  . Bilateral leg pain   . Thyroid disease   . Hypertension   . Chronic headaches   . Left leg weakness   . Shortness of breath     walking up stairs  . Depression   . Kidney stone 1974    hx of during pregnancy  . Hypothyroidism   . Arthritis     thumbs  . Herpes     has not had a breakout in a  long time  . Myopathy     bilateral legs    Assessment: 62 YOF s/p R carotid endarterectomy, pharmacy is consulted for antibiotics renal dose adjustment. Pt. Will be on zinacef 1.5g IV Q 12 hrs for post-op prophylaxis. She received one dose of zinacef 1.5g at 0845 this morning piror to surgery. Afebrile, wbc wnl, scr 0.79, est. crcl ~ 70 ml/min.  Goal of Therapy:  Prevent surgical reaction.   Plan:  Zinacef 1.5g IV Q 12 hrs x 2 doses next dose at 2030 this evening Pharmacy sign off, please reconsult if other antibiotics are needed.  Thanks.   Maryanna Shape, PharmD, BCPS  Clinical Pharmacist  Pager: [phone]  07/05/2013,1:53 PM

## 2013-07-05 NOTE — Anesthesia Procedure Notes (Signed)
Procedure Name: Intubation Date/Time: 07/05/2013 8:29 AM Performed by: Neldon Newport Pre-anesthesia Checklist: Patient identified, Emergency Drugs available, Suction available, Patient being monitored and Timeout performed Patient Re-evaluated:Patient Re-evaluated prior to inductionOxygen Delivery Method: Circle system utilized Preoxygenation: Pre-oxygenation with 100% oxygen Intubation Type: IV induction Ventilation: Mask ventilation without difficulty Laryngoscope Size: Mac and 3 Grade View: Grade I Tube type: Oral Tube size: 7.0 mm Number of attempts: 1 Placement Confirmation: positive ETCO2,  CO2 detector and ETT inserted through vocal cords under direct vision Secured at: 22 cm Tube secured with: Tape Dental Injury: Teeth and Oropharynx as per pre-operative assessment

## 2013-07-05 NOTE — H&P (View-Only) (Signed)
Patient name: Anne Warner MRN: [id number] DOB: 1950-09-15 Sex: female   Referred by: Dr. Sandi Mariscal  Reason for referral:  Chief Complaint  Patient presents with  . Carotid    new pt labs done on 06-29-13    HISTORY OF PRESENT ILLNESS: This is a very pleasant 63 year old female who has been followed for asymptomatic right carotid stenosis.  She denies numbness or weakness in either extremity.  She denies slurred speech.  She denies amaurosis fugax.  She recently had an episode of dizziness that was felt to be related to low blood pressure.  The patient has a history of having her filed or have a carotid endarterectomy in the setting of stroke which was complicated by death.  This was in 07-02-80.  Therefore, she is somewhat apprehensive.  The patient is medically managed for hypertension with 2 medications, including a ARB.  She has hypercholesterolemia but is not taking a statin secondary to muscle weakness.  She is a former smoker.  Past Medical History  Diagnosis Date  . Bilateral leg pain   . Thyroid disease   . Hypertension   . Chronic headaches   . Left leg weakness     Past Surgical History  Procedure Laterality Date  . Colonoscopy    . Appendectomy    . Tonsillectomy and adenoidectomy    . Broken arm       right arm/ with pins    History   Social History  . Marital Status: Divorced    Spouse Name: N/A    Number of Children: N/A  . Years of Education: N/A   Occupational History  . Not on file.   Social History Main Topics  . Smoking status: Former Smoker    Types: Cigarettes    Quit date: 03/16/2007  . Smokeless tobacco: Never Used     Comment: using e cigs  . Alcohol Use: 3.5 oz/week    7 drink(s) per week  . Drug Use: No  . Sexual Activity: Not on file   Other Topics Concern  . Not on file   Social History Narrative  . No narrative on file    Family History  Problem Relation Age of Onset  . Hypertension Mother   . Varicose Veins Mother   .  Diabetes Father   . Hyperlipidemia Father   . Diabetes Brother   . Hypertension Brother     Allergies as of 07/02/2013  . (No Known Allergies)    Current Outpatient Prescriptions on File Prior to Visit  Medication Sig Dispense Refill  . ALPRAZolam (XANAX) 0.5 MG tablet Take 0.5 mg by mouth daily. Take 1/2 tab daily       . celecoxib (CELEBREX) 200 MG capsule Take 200 mg by mouth daily.        . chlorthalidone (HYGROTON) 25 MG tablet Take 12.5 mg by mouth daily.       Marland Kitchen levothyroxine (SYNTHROID, LEVOTHROID) 88 MCG tablet Take 88 mcg by mouth daily.        . cyclobenzaprine (FLEXERIL) 5 MG tablet Take 5 mg by mouth 3 (three) times daily as needed.        . potassium chloride (KLOR-CON) 10 MEQ CR tablet Take 10 mEq by mouth daily. Take 2 tabs daily       . varenicline (CHANTIX) 1 MG tablet Take 1 mg by mouth 2 (two) times daily.         No current facility-administered medications on file prior to  visit.     REVIEW OF SYSTEMS: Cardiovascular: No chest pain, chest pressure, palpitations.  Positive for shortness of breath with exertion, leg pain and varicose vein Pulmonary: No productive cough, asthma or wheezing. Neurologic: No weakness, paresthesias, aphasia, or amaurosis. No dizziness. Hematologic: No bleeding problems or clotting disorders. Musculoskeletal: No joint pain or joint swelling. Gastrointestinal: No blood in stool or hematemesis Genitourinary: No dysuria or hematuria. Psychiatric:: No history of major depression. Integumentary: No rashes or ulcers. Constitutional: No fever or chills.  PHYSICAL EXAMINATION: General: The patient appears their stated age.  Vital signs are BP 132/76  Pulse 65  Ht 5\' 7"  (1.702 m)  Wt 157 lb (71.215 kg)  BMI 24.58 kg/m2  SpO2 100% HEENT:  No gross abnormalities Pulmonary: Respirations are non-labored Abdomen: Soft and non-tender  Musculoskeletal: There are no major deformities.   Neurologic: No focal weakness or paresthesias are  detected, Skin: There are no ulcer or rashes noted. Psychiatric: The patient has normal affect. Cardiovascular: There is a regular rate and rhythm without significant murmur appreciated.  No carotid bruits.  Diagnostic Studies: I have reviewed her carotid ultrasound.  This shows greater than 80% right internal carotid stenosis.  The left side is less than 40%.  Her bifurcation is normal.   Assessment:  Asymptomatic right carotid stenosis Plan: I discussed the risks and benefits of medical management vs. intervention, surgical or stenting.  We decided to proceed with right carotid endarterectomy.  I discussed the risks and benefits, including the risk of stroke, nerve injury, and recurrence.  She was I. to get this done as soon as possible.  I have placed her on the operating schedule for this Thursday, April 23.  All of her questions were answered today.     Eldridge Abrahams, M.D. [facility] Office: [phone] Pager:  [phone]

## 2013-07-05 NOTE — Anesthesia Postprocedure Evaluation (Signed)
  Anesthesia Post-op Note  Patient: Anne Warner  Procedure(s) Performed: Procedure(s): ENDARTERECTOMY CAROTID-RIGHT WITH PATCH ANGIOPLASTY (Right)  Patient Location: PACU  Anesthesia Type:General  Level of Consciousness: awake and alert   Airway and Oxygen Therapy: Patient Spontanous Breathing  Post-op Pain: mild  Post-op Assessment: Post-op Vital signs reviewed and Patient's Cardiovascular Status Stable  Post-op Vital Signs: stable  Last Vitals:  Filed Vitals:   07/05/13 1215  BP:   Pulse: 79  Temp:   Resp: 13    Complications: No apparent anesthesia complications

## 2013-07-05 NOTE — Progress Notes (Signed)
Received pt from [facility]. Pt stable. Central telemetry monitoring notified of move. Vitals stable. Pt settle with all questions answered. Will continue to monitor & provide care per MD orders.

## 2013-07-05 NOTE — Transfer of Care (Signed)
Immediate Anesthesia Transfer of Care Note  Patient: Anne Warner  Procedure(s) Performed: Procedure(s): ENDARTERECTOMY CAROTID-RIGHT WITH PATCH ANGIOPLASTY (Right)  Patient Location: PACU  Anesthesia Type:General  Level of Consciousness: awake, alert  and oriented  Airway & Oxygen Therapy: Patient Spontanous Breathing and Patient connected to nasal cannula oxygen  Post-op Assessment: Report given to PACU RN, Post -op Vital signs reviewed and stable, Patient moving all extremities X 4 and Patient able to stick tongue midline  Post vital signs: Reviewed and stable  Complications: No apparent anesthesia complications

## 2013-07-05 NOTE — Interval H&P Note (Signed)
History and Physical Interval Note:  07/05/2013 8:08 AM  Anne Warner  has presented today for surgery, with the diagnosis of Right Internal Carotid Artery Stenosis    The various methods of treatment have been discussed with the patient and family. After consideration of risks, benefits and other options for treatment, the patient has consented to  Procedure(s): ENDARTERECTOMY CAROTID-RIGHT (Right) as a surgical intervention .  The patient's history has been reviewed, patient examined, no change in status, stable for surgery.  I have reviewed the patient's chart and labs.  Questions were answered to the patient's satisfaction.     [name]

## 2013-07-06 ENCOUNTER — Other Ambulatory Visit: Payer: Self-pay | Admitting: *Deleted

## 2013-07-06 DIAGNOSIS — G8918 Other acute postprocedural pain: Secondary | ICD-10-CM

## 2013-07-06 LAB — BASIC METABOLIC PANEL
BUN: 12 mg/dL (ref 6–23)
CO2: 23 meq/L (ref 19–32)
Calcium: 8.6 mg/dL (ref 8.4–10.5)
Chloride: 101 mEq/L (ref 96–112)
Creatinine, Ser: 0.66 mg/dL (ref 0.50–1.10)
GFR calc Af Amer: >90 mL/min (ref >90)
GFR calc non Af Amer: >90 mL/min (ref >90)
GLUCOSE: 114 mg/dL — AB (ref 70–99)
POTASSIUM: 3.1 meq/L — AB (ref 3.7–5.3)
Sodium: 139 mEq/L (ref 137–147)

## 2013-07-06 LAB — CBC
HCT: 36.7 % (ref 36.0–46.0)
HEMOGLOBIN: 12.7 g/dL (ref 12.0–15.0)
MCH: 34.5 pg — ABNORMAL HIGH (ref 26.0–34.0)
MCHC: 34.6 g/dL (ref 30.0–36.0)
MCV: 99.7 fL (ref 78.0–100.0)
Platelets: 161 10*3/uL (ref 150–400)
RBC: 3.68 MIL/uL — ABNORMAL LOW (ref 3.87–5.11)
RDW: 12.7 % (ref 11.5–15.5)
WBC: 6 10*3/uL (ref 4.0–10.5)

## 2013-07-06 MED ORDER — TRAMADOL HCL 50 MG PO TABS: 50.0000 mg | ORAL_TABLET | Freq: Four times a day (QID) | ORAL | Status: DC | PRN

## 2013-07-06 MED ORDER — OXYCODONE-ACETAMINOPHEN 5-325 MG PO TABS: 1.0000 | ORAL_TABLET | Freq: Four times a day (QID) | ORAL | Status: DC | PRN

## 2013-07-06 NOTE — Progress Notes (Signed)
Pt with complaints of pelvic pressure, states she feels like she has to void but unable to start a stream. Has voided twice spontaneously, 228ml and still feels the urge to void. Bladder scan shows 956ml in bladder. In and out cath performed at [facility] and 1200cc returned. Pt states she feels much better, will continue monitoring for spontaneous void.  Pt voided 650ml spontaneously at 7262 with out complication. No further complaints of pressure or discomfort. Has voided multiple times throughout the night without issue

## 2013-07-06 NOTE — Progress Notes (Signed)
Patient did not receive her written rx for percocet when she discharged today. She is going to be living with her daughter, Colonel Bald, in [place] while she is recovering from her Right CEA done 07-05-13 by Dr. Trula Slade. Patient reports that she is having a slight amount of pain at this time and is afebrile. She is having no difficulty with swallowing liquids or with eating. The family is unable to come back to our office to get a written RX for percocet so the patient asked that we give a verbal Rx for ultram. This was OK'd per Dr. Donnetta Hutching.

## 2013-07-06 NOTE — Progress Notes (Addendum)
[facility]  Subjective  - Doing well.  Over night in/out cath.  Able to void independently this am times 3.  Taking PO's well and ambulating.   Objective 116/64 87 98.6 F (37 C) (Oral) 17 94%  Intake/Output Summary (Last 24 hours) at 07/06/13 0725 Last data filed at 07/06/13 0600  Gross per 24 hour  Intake   4150 ml  Output   3450 ml  Net    700 ml    Palpable radial pulses equal No tongue deviation, facial muscle is symmetric with active smile, but right side droop at rest Incision C/D no hematoma  Assessment/Planning: POD #1 right carotid Endarterectomy with bovine pericardial patch angioplasty D/C home  F/U in 2 weeks   Ulyses Amor 07/06/2013 7:25 AM --  Laboratory Lab Results:  Recent Labs  07/05/13 1530 07/06/13 0515  WBC 8.5 6.0  HGB 13.5 12.7  HCT 39.5 36.7  PLT 151 161   BMET  Recent Labs  07/04/13 1341 07/05/13 1530 07/06/13 0515  NA 141  --  139  K 4.1  --  3.1*  CL 99  --  101  CO2 29  --  23  GLUCOSE 117*  --  114*  BUN 16  --  12  CREATININE 0.79 0.69 0.66  CALCIUM 10.1  --  8.6    COAG Lab Results  Component Value Date   INR 0.85 07/04/2013   No results found for this basename: PTT      I agree with the above.  The patient is postoperative day one from right carotid endarterectomy secondary to asymptomatic high-grade stenosis.  She remains neurologically intact.  She did have to be in and out cath last night but is able to spontaneously void today.  She is swallowing without difficulty.  She has a slight marginal mandibular neurapraxia.  She is stable for discharge.  She'll followup in 2 weeks.  Annamarie Major

## 2013-07-06 NOTE — Discharge Summary (Signed)
[facility] Discharge Summary   Patient ID:  Anne Warner MRN: [id number] DOB/AGE: 1950/05/23 63 y.o.  Admit date: 07/05/2013 Discharge date: 07/06/2013 Date of Surgery: 07/05/2013 Surgeon: Surgeon(s): [name], MD Mal Misty, MD  Admission Diagnosis: Right Internal Carotid Artery Stenosis    Discharge Diagnoses:  Right Internal Carotid Artery Stenosis    Secondary Diagnoses: Past Medical History  Diagnosis Date  . Bilateral leg pain   . Thyroid disease   . Hypertension   . Chronic headaches   . Left leg weakness   . Shortness of breath     walking up stairs  . Depression   . Kidney stone 1974    hx of during pregnancy  . Hypothyroidism   . Arthritis     thumbs  . Herpes     has not had a breakout in a long time  . Myopathy     bilateral legs    Procedure(s): ENDARTERECTOMY CAROTID-RIGHT WITH PATCH ANGIOPLASTY  Discharged Condition: good  HPI:63 y/o female with asymptomatic right carotid stenosis.    Her carotid ultrasound. This shows greater than 80% right internal carotid stenosis. The left side is less than 40%. Her bifurcation is normal.   She under went right carotid Endarterectomy with bovine pericardial patch angioplasty on 07/04/2013 by Dr. Trula Slade.  She had voiding difficulty over night and was in/out cath.  She has since been able to void times 3 independently.  She has a passive right sided facial droop, but actively her smile is symmetrical.  She is taking PO's well and has ambulated.  She will be discharged home today and follow up in the office in 2 weeks.      Hospital Course:  Anne Warner is a 63 y.o. female is S/P Right Procedure(s): ENDARTERECTOMY CAROTID-RIGHT WITH PATCH ANGIOPLASTY Extubated: POD # 0 Physical exam: Palpable radial pulses equal  No tongue deviation, facial muscle is symmetric with active smile, but right side droop at rest  Incision C/D no hematoma  Post-op wounds healing well Pt. Ambulating,  voiding and taking PO diet without difficulty. Pt pain controlled with PO pain meds. Labs as below Complications:See HPI  Consults:     Significant Diagnostic Studies: CBC Lab Results  Component Value Date   WBC 6.0 07/06/2013   HGB 12.7 07/06/2013   HCT 36.7 07/06/2013   MCV 99.7 07/06/2013   PLT 161 07/06/2013    BMET    Component Value Date/Time   NA 139 07/06/2013 0515   K 3.1* 07/06/2013 0515   CL 101 07/06/2013 0515   CO2 23 07/06/2013 0515   GLUCOSE 114* 07/06/2013 0515   BUN 12 07/06/2013 0515   CREATININE 0.66 07/06/2013 0515   CALCIUM 8.6 07/06/2013 0515   GFRNONAA >90 07/06/2013 0515   GFRAA >90 07/06/2013 0515   COAG Lab Results  Component Value Date   INR 0.85 07/04/2013     Disposition:  Discharge to :Home Discharge Orders   Future Orders Complete By Expires   Call MD for:  redness, tenderness, or signs of infection (pain, swelling, bleeding, redness, odor or green/yellow discharge around incision site)  As directed    Call MD for:  severe or increased pain, loss or decreased feeling  in affected limb(s)  As directed    Call MD for:  temperature >100.5  As directed    Discharge instructions  As directed    Driving Restrictions  As directed    Increase activity slowly  As directed  Lifting restrictions  As directed    May shower   As directed    Resume previous diet  As directed        Medication List         ALPRAZolam 0.5 MG tablet  Commonly known as:  XANAX  Take 0.5 mg by mouth at bedtime.     aspirin 81 MG tablet  Take 81 mg by mouth daily.     CALTRATE 600+D PO  Take 2 tablets by mouth at bedtime.     celecoxib 200 MG capsule  Commonly known as:  CELEBREX  Take 200 mg by mouth daily.     chlorthalidone 25 MG tablet  Commonly known as:  HYGROTON  Take 12.5 mg by mouth daily.     levothyroxine 75 MCG tablet  Commonly known as:  SYNTHROID, LEVOTHROID  Take 75 mcg by mouth daily before breakfast.     losartan 50 MG tablet  Commonly  known as:  COZAAR  Take 50 mg by mouth daily.     mupirocin ointment 2 %  Commonly known as:  BACTROBAN  Place 1 application into the nose once as needed (Takes once daily at night for dry nose PRN).     oxyCODONE-acetaminophen 5-325 MG per tablet  Commonly known as:  PERCOCET/ROXICET  Take 1 tablet by mouth every 6 (six) hours as needed for moderate pain.     SYSTANE OP  Place 2 drops into both eyes daily as needed (dry eye).       Verbal and written Discharge instructions given to the patient. Wound care per Discharge AVS   Signed: Ulyses Amor 07/06/2013, 9:23 AM  --- For VQI Registry use --- Instructions: Press F2 to tab through selections.  Delete question if not applicable.   Modified Rankin score at D/C (0-6): Rankin Score=0  IV medication needed for:  1. Hypertension: No 2. Hypotension: No  Post-op Complications: No  1. Post-op CVA or TIA: No  If yes: Event classification (right eye, left eye, right cortical, left cortical, verterobasilar, other):   If yes: Timing of event (intra-op, <6 hrs post-op, >=6 hrs post-op, unknown):   2. CN injury:   If yes: CN  injuried right facial droop  3. Myocardial infarction: No  If yes: Dx by (EKG or clinical, Troponin):   4.  CHF: No  5.  Dysrhythmia (new): No  6. Wound infection: No  7. Reperfusion symptoms: No  8. Return to OR: No  If yes: return to OR for (bleeding, neurologic, other CEA incision, other):   Discharge medications: Statin use:  No  for medical reason   ASA use:  Yes Beta blocker use:  No  for medical reason   ACE-Inhibitor use:  No  for medical reason   P2Y12 Antagonist use: [x ] None, [ ]  Plavix, [ ]  Plasugrel, [ ]  Ticlopinine, [ ]  Ticagrelor, [ ]  Other, [ ]  No for medical reason, [ ]  Non-compliant, [ ]  Not-indicated Anti-coagulant use:  [ x] None, [ ]  Warfarin, [ ]  Rivaroxaban, [ ]  Dabigatran, [ ]  Other, [ ]  No for medical reason, [ ]  Non-compliant, [ ]  Not-indicated    I agree with  the above.  The patient is postoperative day one from right carotid endarterectomy secondary to asymptomatic high-grade stenosis.  She remains neurologically intact.  She did have to be in and out cath last night but is able to spontaneously void today.  She is swallowing without difficulty.  She has a  slight marginal mandibular neurapraxia.  She is stable for discharge.  She'll followup in 2 weeks.  Annamarie Major

## 2013-07-10 ENCOUNTER — Encounter (HOSPITAL_COMMUNITY): Payer: Self-pay | Admitting: Surgery

## 2013-07-11 ENCOUNTER — Telehealth: Payer: Self-pay

## 2013-07-11 NOTE — Telephone Encounter (Signed)
Phone call from pt.  Reported noticeable swelling in the upper half of the incision. Describes a knot about size of a lima bean; stated it is soft and nonpulsatile.  Stated right neck incision is intact; no drainage.  Reported a sore throat, making it difficult to swallow.  Denies difficulty with managing her food with swallowing; stated "it just hurts to swallow.  C/o it being difficult to open mouth due to discomfort in the jaw and throat.  Encouraged to continue to monitor the swelling and report worsening; advised the swelling is not unusual at 1 week post op.  Encouraged to do a gentle salt water gargle, or to use throat lozenges to soothe the throat. Pt. stated she has been doing well, but today feels like she has been hit by a truck.  Reported she is in the process of a move, and is careful not to lift anything.  Encouraged to maintain good hydration, and to take it easy, to avoid overdoing.  Encouraged to call office is symptoms worsen.  Verb. understanding.

## 2013-07-20 ENCOUNTER — Encounter: Payer: Self-pay | Admitting: Surgery

## 2013-07-23 ENCOUNTER — Ambulatory Visit (INDEPENDENT_AMBULATORY_CARE_PROVIDER_SITE_OTHER): Payer: Self-pay | Admitting: Surgery

## 2013-07-23 ENCOUNTER — Encounter: Payer: Self-pay | Admitting: Surgery

## 2013-07-23 VITALS — BP 100/62 | HR 64 | Temp 98.4°F | Ht 67.0 in | Wt 158.6 lb

## 2013-07-23 DIAGNOSIS — Z48812 Encounter for surgical aftercare following surgery on the circulatory system: Secondary | ICD-10-CM

## 2013-07-23 DIAGNOSIS — I6529 Occlusion and stenosis of unspecified carotid artery: Secondary | ICD-10-CM | POA: Insufficient documentation

## 2013-07-23 NOTE — Progress Notes (Signed)
Postoperative Visit   History of Present Illness  Anne Warner is a 63 y.o. female who presents for postoperative follow-up for: right CEA (Date: 4/23/22015).  The patient's neck incision is well healed.  The patient has not had  stroke or TIA symptoms.  She has a post-op right facial droop.  For VQI Use Only  PRE-ADM LIVING: Home  AMB STATUS: Ambulatory  History   Social History  . Marital Status: Divorced    Spouse Name: N/A    Number of Children: N/A  . Years of Education: N/A   Occupational History  . Not on file.   Social History Main Topics  . Smoking status: Former Smoker    Types: Cigarettes    Quit date: 03/16/2007  . Smokeless tobacco: Never Used     Comment: using e cigs  . Alcohol Use: 3.5 oz/week    7 drink(s) per week  . Drug Use: No  . Sexual Activity: Not on file   Other Topics Concern  . Not on file   Social History Narrative  . No narrative on file    Current Outpatient Prescriptions on File Prior to Visit  Medication Sig Dispense Refill  . ALPRAZolam (XANAX) 0.5 MG tablet Take 0.5 mg by mouth at bedtime.       Marland Kitchen aspirin 81 MG tablet Take 81 mg by mouth daily.      . Calcium Carbonate-Vitamin D (CALTRATE 600+D PO) Take 2 tablets by mouth at bedtime.       . celecoxib (CELEBREX) 200 MG capsule Take 200 mg by mouth daily.        . chlorthalidone (HYGROTON) 25 MG tablet Take 12.5 mg by mouth daily.       Marland Kitchen levothyroxine (SYNTHROID, LEVOTHROID) 75 MCG tablet Take 75 mcg by mouth daily before breakfast.      . losartan (COZAAR) 50 MG tablet Take 50 mg by mouth daily.      . traMADol (ULTRAM) 50 MG tablet Take 1 tablet (50 mg total) by mouth every 6 (six) hours as needed.  30 tablet  0  . mupirocin ointment (BACTROBAN) 2 % Place 1 application into the nose once as needed (Takes once daily at night for dry nose PRN).      Marland Kitchen oxyCODONE-acetaminophen (PERCOCET/ROXICET) 5-325 MG per tablet Take 1 tablet by mouth every 6 (six) hours as needed for moderate  pain.  30 tablet  0  . Polyethyl Glycol-Propyl Glycol (SYSTANE OP) Place 2 drops into both eyes daily as needed (dry eye).       No current facility-administered medications on file prior to visit.    Physical Examination  Filed Vitals:   07/23/13 1547  BP: 100/62  Pulse:   Temp: 98.4 F (36.9 C)    Right Neck: Incision is well healed Neuro: CN 2-12 are intact  except right facial droop, Motor strength is 5/5  bilaterally, sensation is  grossly intact  Medical Decision Making  Anne Warner is a 63 y.o. female who presents s/p right CEA.  The patient's neck incision is well healing with no stroke symptoms.  Dr. Trula Slade exam and talked to the patient.  He discussed that the incision will heal and soften over the next 3 months, the facial droop will recover with time.    She will f/u in 6 months for bilateral carotid duplex.  Ulyses Amor PA-C [facility] Office: [phone]   I agree with the above.  The  patient is status post right carotid endarterectomy with patch angioplasty on 07/05/2013.  This was done in the setting of a symptomatic right carotid stenosis.  Intraoperative findings included 90% stenosis.  The patient was discharged to home on postoperative day 1.  She did have a right marginal mandibular neurapraxia.  She has no complaints today.  On examination her incision is healed nicely.  She is neurologically intact.  She has a right marginal mandibular nerve neuropraxia.  Overall she looks great.  She will followup in 6 months with a repeat ultrasound

## 2013-07-23 NOTE — Addendum Note (Signed)
Addended by: Mena Goes on: 07/23/2013 05:01 PM   Modules accepted: Orders

## 2013-12-26 ENCOUNTER — Ambulatory Visit (HOSPITAL_COMMUNITY)
Admission: RE | Admit: 2013-12-26 | Discharge: 2013-12-26 | Disposition: A | Discharge status: Home / Self Care | Payer: Private Health Insurance - Indemnity | Source: Ambulatory Visit | Attending: Surgery | Admitting: Surgery

## 2013-12-26 DIAGNOSIS — I6529 Occlusion and stenosis of unspecified carotid artery: Secondary | ICD-10-CM | POA: Diagnosis not present

## 2013-12-26 DIAGNOSIS — Z48812 Encounter for surgical aftercare following surgery on the circulatory system: Secondary | ICD-10-CM | POA: Diagnosis not present

## 2013-12-28 ENCOUNTER — Encounter: Payer: Self-pay | Admitting: Surgery

## 2013-12-31 ENCOUNTER — Ambulatory Visit (INDEPENDENT_AMBULATORY_CARE_PROVIDER_SITE_OTHER): Payer: PRIVATE HEALTH INSURANCE | Admitting: Surgery

## 2013-12-31 ENCOUNTER — Encounter: Payer: Self-pay | Admitting: Surgery

## 2013-12-31 VITALS — BP 122/69 | HR 52 | Ht 67.0 in | Wt 158.7 lb

## 2013-12-31 DIAGNOSIS — I6521 Occlusion and stenosis of right carotid artery: Secondary | ICD-10-CM

## 2013-12-31 NOTE — Progress Notes (Signed)
Patient name: Anne Warner MRN: [id number] DOB: 12-17-1950 Sex: female     Chief Complaint  Patient presents with  . Re-evaluation    6 month f/u     HISTORY OF PRESENT ILLNESS: The patient is status post right carotid endarterectomy with patch angioplasty on 07/05/2013. This was done in the setting of asymptomatic right carotid stenosis. Intraoperative findings included 90% stenosis. The patient was discharged to home on postoperative day 1. She did have a right marginal mandibular neurapraxia, which has resolved.  She reports no new neurologic deficits.  She does have some numbness and tingling around her incision.   Past Medical History  Diagnosis Date  . Bilateral leg pain   . Thyroid disease   . Hypertension   . Chronic headaches   . Left leg weakness   . Shortness of breath     walking up stairs  . Depression   . Kidney stone 1974    hx of during pregnancy  . Hypothyroidism   . Arthritis     thumbs  . Herpes     has not had a breakout in a long time  . Myopathy     bilateral legs  . Carotid artery occlusion     Past Surgical History  Procedure Laterality Date  . Colonoscopy    . Appendectomy    . Tonsillectomy and adenoidectomy    . Broken arm       right arm/ with pins  . Tonsillectomy    . Endarterectomy Right 07/05/2013    Procedure: ENDARTERECTOMY CAROTID-RIGHT WITH PATCH ANGIOPLASTY;  Surgeon: [name], MD;  Location: [facility] OR;  Service: Vascular;  Laterality: Right;  . Carotid endarterectomy      History   Social History  . Marital Status: Divorced    Spouse Name: N/A    Number of Children: N/A  . Years of Education: N/A   Occupational History  . Not on file.   Social History Main Topics  . Smoking status: Former Smoker    Types: Cigarettes    Quit date: 03/16/2007  . Smokeless tobacco: Never Used     Comment: using e cigs  . Alcohol Use: 3.5 oz/week    7 drink(s) per week  . Drug Use: No  . Sexual Activity: Not on file   Other  Topics Concern  . Not on file   Social History Narrative  . No narrative on file    Family History  Problem Relation Age of Onset  . Hypertension Mother   . Varicose Veins Mother   . Diabetes Father   . Hyperlipidemia Father   . Diabetes Brother   . Hypertension Brother     Allergies as of 12/31/2013  . (No Known Allergies)    Current Outpatient Prescriptions on File Prior to Visit  Medication Sig Dispense Refill  . ALPRAZolam (XANAX) 0.5 MG tablet Take 0.5 mg by mouth at bedtime.       Marland Kitchen aspirin 81 MG tablet Take 81 mg by mouth daily.      . Calcium Carbonate-Vitamin D (CALTRATE 600+D PO) Take 2 tablets by mouth at bedtime.       . celecoxib (CELEBREX) 200 MG capsule Take 200 mg by mouth daily.        . chlorthalidone (HYGROTON) 25 MG tablet Take 12.5 mg by mouth daily.       Marland Kitchen levothyroxine (SYNTHROID, LEVOTHROID) 75 MCG tablet Take 75 mcg by mouth daily before breakfast.      .  losartan (COZAAR) 50 MG tablet Take 50 mg by mouth daily.      . mupirocin ointment (BACTROBAN) 2 % Place 1 application into the nose once as needed (Takes once daily at night for dry nose PRN).      Marland Kitchen oxyCODONE-acetaminophen (PERCOCET/ROXICET) 5-325 MG per tablet Take 1 tablet by mouth every 6 (six) hours as needed for moderate pain.  30 tablet  0  . Polyethyl Glycol-Propyl Glycol (SYSTANE OP) Place 2 drops into both eyes daily as needed (dry eye).      . traMADol (ULTRAM) 50 MG tablet Take 1 tablet (50 mg total) by mouth every 6 (six) hours as needed.  30 tablet  0   No current facility-administered medications on file prior to visit.     REVIEW OF SYSTEMS: Please see history of present illness, otherwise all systems are negative  PHYSICAL EXAMINATION:   Vital signs are BP 122/69  Pulse 52  Ht 5\' 7"  (1.702 m)  Wt 158 lb 11.2 oz (71.986 kg)  BMI 24.85 kg/m2  SpO2 100% General: The patient appears their stated age. HEENT:  No gross abnormalities Pulmonary:  Non labored  breathing Musculoskeletal: There are no major deformities. Neurologic: No focal weakness or paresthesias are detected, Skin: There are no ulcer or rashes noted. Psychiatric: The patient has normal affect. Cardiovascular: There is a regular rate and rhythm without significant murmur appreciated.  No carotid artery   Diagnostic Studies Ultrasound was ordered and reviewed today.  This shows a widely patent right carotid endarterectomy site and less than 40% left carotid stenosis  Assessment: Status post right carotid endarterectomy Plan: The patient is doing very well with no neurologic symptoms.  Her marginal mandibular neurapraxia has resolved.  Ultrasound today shows a widely patent carotid endarterectomy site with no evidence of restenosis.  The patient has relocated near [place].  She is going to get subsequent followup care in [place].  Eldridge Abrahams, M.D. [facility] Office: [phone] Pager:  [phone]

## 2014-01-21 ENCOUNTER — Other Ambulatory Visit (HOSPITAL_COMMUNITY): Payer: PRIVATE HEALTH INSURANCE

## 2014-01-21 ENCOUNTER — Ambulatory Visit: Payer: PRIVATE HEALTH INSURANCE | Admitting: Surgery

## 2014-04-10 ENCOUNTER — Telehealth: Payer: Self-pay | Admitting: Cardiology

## 2014-04-10 NOTE — Telephone Encounter (Signed)
New message  Dr. Aundra Dubin referral from Dr. Sandi Mariscal  Appt Notes: Lauren with Dr. Sandi Mariscal [phone] [facility] practice// DX: Carotid stenosis and hyperlipidemia with an intolerance to all statins//Will fax notes//SR Please let them know that it is ok to call

## 2014-04-10 NOTE — Telephone Encounter (Signed)
LMTCB

## 2014-04-10 NOTE — Telephone Encounter (Signed)
Lauren advised it is OK to schedule the patient with Dr Aundra Dubin since Dr Sandi Mariscal specifically requesting Dr Aundra Dubin. Lauren advised I will forward to schedulers to follow up with her about appt for pt with Dr Aundra Dubin.

## 2014-05-01 ENCOUNTER — Telehealth: Payer: Self-pay | Admitting: Cardiology

## 2014-05-01 NOTE — Telephone Encounter (Signed)
Received records from Dr Derinda Late for appointment with Dr Percival Spanish on 05/07/14.  Records given to [facility] (medical records) for Dr Hochrein's schedule on 05/07/14.  lp

## 2014-05-06 ENCOUNTER — Other Ambulatory Visit: Payer: Self-pay

## 2014-05-07 ENCOUNTER — Encounter: Payer: Self-pay | Admitting: Cardiology

## 2014-05-07 ENCOUNTER — Ambulatory Visit (INDEPENDENT_AMBULATORY_CARE_PROVIDER_SITE_OTHER): Payer: BLUE CROSS/BLUE SHIELD | Admitting: Cardiology

## 2014-05-07 VITALS — BP 122/68 | HR 78 | Ht 67.0 in | Wt 164.8 lb

## 2014-05-07 DIAGNOSIS — R002 Palpitations: Secondary | ICD-10-CM | POA: Diagnosis not present

## 2014-05-07 NOTE — Patient Instructions (Signed)
Your physician recommends that you schedule a follow-up appointment in: as needed with Dr. Percival Spanish  We are ordering a stress test for you to get done and the same day you are here for your stress test you will see Kelle Darting our Pharmacist and she will speak to you about a medication in a category called PCSK9

## 2014-05-07 NOTE — Progress Notes (Signed)
Cardiology Office Note   Date:  05/07/2014   ID:  Anne Warner, DOB [birth year], MRN [id number]  PCP:  Marylene Land, MD  Cardiologist:   Minus Breeding, MD   No chief complaint on file.     History of Present Illness: Anne Warner is a 64 y.o. female who presents for evaluation of dyslipidemia. She does have a history of peripheral vascular disease with carotid endarterectomy. She has dyslipidemia as a risk factor. She unfortunately has been intolerant of statins has a myopathy and chronically elevated CK that has required extensive workup is quite symptomatic. She has been intolerant of niacin as well. On niacin her LDL most recently was 137 area this is above target. She does have chronic muscle aches in her legs in particular. Despite this she can do activities such as low-impact aerobics. With this she denies any chest pressure, neck or arm discomfort. She denies any palpitations, presyncope or syncope. She rarely gets shortness of breath with activity but this is mild. She doesn't have any PND or orthopnea.   Past Medical History  Diagnosis Date  . Bilateral leg pain   . Thyroid disease   . Hypertension   . Chronic headaches   . Left leg weakness   . Shortness of breath     walking up stairs  . Depression   . Kidney stone 1974    hx of during pregnancy  . Hypothyroidism   . Arthritis     thumbs  . Herpes     has not had a breakout in a long time  . Myopathy     bilateral legs  . Carotid artery occlusion     Past Surgical History  Procedure Laterality Date  . Colonoscopy    . Appendectomy    . Tonsillectomy and adenoidectomy    . Broken arm       right arm/ with pins  . Tonsillectomy    . Endarterectomy Right 07/05/2013    Procedure: ENDARTERECTOMY CAROTID-RIGHT WITH PATCH ANGIOPLASTY;  Surgeon: [name], MD;  Location: [facility] OR;  Service: Vascular;  Laterality: Right;  . Carotid endarterectomy       Current Outpatient Prescriptions  Medication Sig  Dispense Refill  . ALPRAZolam (XANAX) 0.5 MG tablet Take 0.5-1 mg by mouth at bedtime.     Marland Kitchen aspirin 81 MG tablet Take 81 mg by mouth daily.    . Calcium Carbonate-Vitamin D (CALTRATE 600+D PO) Take 2 tablets by mouth at bedtime.     . celecoxib (CELEBREX) 200 MG capsule Take 200 mg by mouth daily.      . chlorthalidone (HYGROTON) 25 MG tablet Take 12.5 mg by mouth daily.     Marland Kitchen levothyroxine (SYNTHROID, LEVOTHROID) 75 MCG tablet Take 75 mcg by mouth daily before breakfast.    . losartan (COZAAR) 50 MG tablet Take 50 mg by mouth daily.    . mupirocin ointment (BACTROBAN) 2 % Place 1 application into the nose at bedtime as needed (dry nose).     Vladimir Faster Glycol-Propyl Glycol (SYSTANE OP) Place 2 drops into both eyes daily as needed (dry eye).     No current facility-administered medications for this visit.    Allergies:   Review of patient's allergies indicates no known allergies.    Social History:  The patient  reports that she quit smoking about 7 years ago. Her smoking use included Cigarettes. She has never used smokeless tobacco. She reports that she drinks about 3.5 oz of alcohol  per week. She reports that she does not use illicit drugs.   Family History:  The patient's family history includes Diabetes in her brother and father; Hyperlipidemia in her father; Hypertension in her brother and mother; Varicose Veins in her mother.    ROS:  Please see the history of present illness.   Otherwise, review of systems are positive for back pain, joint pains.   All other systems are reviewed and negative.    PHYSICAL EXAM: VS:  BP 122/68 mmHg  Pulse 78  Ht 5\' 7"  (1.702 m)  Wt 164 lb 12.8 oz (74.753 kg)  BMI 25.81 kg/m2 , BMI Body mass index is 25.81 kg/(m^2). GENERAL:  Well appearing HEENT:  Pupils equal round and reactive, fundi not visualized, oral mucosa unremarkable NECK:  No jugular venous distention, waveform within normal limits, carotid upstroke brisk and symmetric, no bruits, no  thyromegaly LYMPHATICS:  No cervical, inguinal adenopathy LUNGS:  Clear to auscultation bilaterally BACK:  No CVA tenderness CHEST:  Unremarkable HEART:  PMI not displaced or sustained,S1 and S2 within normal limits, no S3, no S4, no clicks, no rubs, no murmurs ABD:  Flat, positive bowel sounds normal in frequency in pitch, no bruits, no rebound, no guarding, no midline pulsatile mass, no hepatomegaly, no splenomegaly EXT:  2 plus pulses throughout, no edema, no cyanosis no clubbing SKIN:  No rashes no nodules NEURO:  Cranial nerves II through XII grossly intact, motor grossly intact throughout PSYCH:  Cognitively intact, oriented to person place and time    EKG:  EKG is ordered today. The ekg ordered today demonstrates sinus rhythm, rate 78, axis within normal limits, intervals within normal limits, no acute ST-T wave changes.   Recent Labs: 07/04/2013: ALT 55* 07/06/2013: BUN 12; Creatinine 0.66; Hemoglobin 12.7; Platelets 161; Potassium 3.1*; Sodium 139    Lipid Panel No results found for: CHOL, TRIG, HDL, CHOLHDL, VLDL, LDLCALC, LDLDIRECT    Wt Readings from Last 3 Encounters:  05/07/14 164 lb 12.8 oz (74.753 kg)  12/31/13 158 lb 11.2 oz (71.986 kg)  07/23/13 158 lb 9.6 oz (71.94 kg)      Other studies Reviewed: Additional studies/ records that were reviewed today include: Extensive outpatient records.. Review of the above records demonstrates:  Please see elsewhere in the note.    ASSESSMENT AND PLAN:  DYSLIPIDEMIA:  The patient has known peripheral vascular disease. The goal LDL should be less than 100. She cannot achieve these with statins as she is completely intolerant. I will send her to the lipid clinic to start PCSK9 inhibitors.  RISK REDUCTION:  She has risk factors. He does have some mild dyspnea with exertion. I will bring the patient back for a POET (Plain Old Exercise Test). This will allow me to screen for obstructive coronary disease, risk stratify and very  importantly provide a prescription for exercise.  Of note I did see a previous CAT scan which did not suggest any coronary calcification.   Current medicines are reviewed at length with the patient today.  The patient does not have concerns regarding medicines.  The following changes have been made:  no change  Labs/ tests ordered today include: None  No orders of the defined types were placed in this encounter.     Disposition:   FU with me as needed.  She will follow in our lipid clinic.     Signed, Minus Breeding, MD  05/07/2014 12:11 PM    [facility]

## 2014-05-24 ENCOUNTER — Other Ambulatory Visit: Payer: Self-pay

## 2014-05-24 ENCOUNTER — Encounter (HOSPITAL_COMMUNITY): Payer: Self-pay | Admitting: *Deleted

## 2014-05-27 LAB — CYTOLOGY - PAP

## 2014-05-30 ENCOUNTER — Telehealth (HOSPITAL_COMMUNITY): Payer: Self-pay

## 2014-05-30 NOTE — Telephone Encounter (Signed)
Encounter complete. 

## 2014-06-05 ENCOUNTER — Ambulatory Visit (INDEPENDENT_AMBULATORY_CARE_PROVIDER_SITE_OTHER): Payer: BLUE CROSS/BLUE SHIELD | Admitting: Pharmacist Clinician (PhC)/ Clinical Pharmacy Specialist

## 2014-06-05 ENCOUNTER — Ambulatory Visit (HOSPITAL_COMMUNITY)
Admission: RE | Admit: 2014-06-05 | Discharge: 2014-06-05 | Disposition: A | Discharge status: Home / Self Care | Payer: BLUE CROSS/BLUE SHIELD | Source: Ambulatory Visit | Attending: Cardiovascular Disease | Admitting: Cardiovascular Disease

## 2014-06-05 VITALS — Ht 66.75 in | Wt 164.2 lb

## 2014-06-05 DIAGNOSIS — R002 Palpitations: Secondary | ICD-10-CM | POA: Diagnosis not present

## 2014-06-05 DIAGNOSIS — R9439 Abnormal result of other cardiovascular function study: Secondary | ICD-10-CM | POA: Diagnosis not present

## 2014-06-05 DIAGNOSIS — R0609 Other forms of dyspnea: Secondary | ICD-10-CM | POA: Diagnosis not present

## 2014-06-05 DIAGNOSIS — E785 Hyperlipidemia, unspecified: Secondary | ICD-10-CM | POA: Diagnosis not present

## 2014-06-05 NOTE — Procedures (Signed)
Exercise Treadmill Test  Pre-Exercise Testing Evaluation                  Test  Exercise Tolerance Test Ordering MD: Marijo File, MD    Unique Test No: 1  Treadmill:  1  Indication for ETT: exertional dyspnea  Contraindication to ETT: No   Stress Modality: exercise - treadmill  Cardiac Imaging Performed: non   Protocol: standard Bruce - maximal  Max BP:  184/93  Max MPHR (bpm):  157 85% MPR (bpm):  133  MPHR obtained (bpm):  153 % MPHR obtained:  97  Reached 85% MPHR (min:sec):  4  Total Exercise Time (min-sec):  6  Workload in METS:  7.0 Borg Scale: 15  Reason ETT Terminated:  ST depression  and SOB    ST Segment Analysis At Rest: normal ST segments - no evidence of significant ST depression With Exercise: significant ischemic ST depression  Other Information Arrhythmia:  No Angina during ETT:  absent (0) Quality of ETT:  diagnostic  ETT Interpretation:  abnormal - evidence of ST depression consistent with ischemia  Comments: Abnormal GXT with ST depression  Recommendations: Follow up with Dr. Percival Spanish

## 2014-06-06 ENCOUNTER — Encounter: Payer: Self-pay | Admitting: Pharmacist Clinician (PhC)/ Clinical Pharmacy Specialist

## 2014-06-06 ENCOUNTER — Telehealth: Payer: Self-pay | Admitting: Cardiology

## 2014-06-06 DIAGNOSIS — E785 Hyperlipidemia, unspecified: Secondary | ICD-10-CM | POA: Insufficient documentation

## 2014-06-06 NOTE — Progress Notes (Signed)
06/06/2014 Anne Warner October 29, 1950 [id number]   HPI:  Anne Warner is a 64 y.o. female patient of Dr Percival Spanish, who presents today for a lipid clinic evaluation.  RF:  PVD, R carotid endarterectomy (06/2013)  Meds: none currently   Intolerant: - statins caused elevated CK, myositis; stopped niacin in 2015 due to flushing; check records with Dr. Sandi Mariscal  Family history:  Father stroke at 49, died after endarterectomy; MGM died from MI at 81, 1 brother with HTN, DM  Diet: smoothies for breakfast 3-4 times/week (kale, spinach, berries, fruit), no fast foods, no fried foods, plenty of fresh fruits and vegetables  Exercise: aerobics and yoga 3 x per week, 90 minutes each session    Labs:  03/2014 - TC 260, TG 109, HDL 101, LDL 137 01/2014 - TC 207. TG 71, HDL 95, LDL 98, CK 399    Current Outpatient Prescriptions  Medication Sig Dispense Refill  . ALPRAZolam (XANAX) 0.5 MG tablet Take 0.5-1 mg by mouth at bedtime.     Marland Kitchen aspirin 81 MG tablet Take 81 mg by mouth daily.    . Calcium Carbonate-Vitamin D (CALTRATE 600+D PO) Take 2 tablets by mouth at bedtime.     . celecoxib (CELEBREX) 200 MG capsule Take 200 mg by mouth daily.      . chlorthalidone (HYGROTON) 25 MG tablet Take 12.5 mg by mouth daily.     Marland Kitchen levothyroxine (SYNTHROID, LEVOTHROID) 75 MCG tablet Take 75 mcg by mouth daily before breakfast.    . losartan (COZAAR) 50 MG tablet Take 50 mg by mouth daily.    . mupirocin ointment (BACTROBAN) 2 % Place 1 application into the nose at bedtime as needed (dry nose).     Vladimir Faster Glycol-Propyl Glycol (SYSTANE OP) Place 2 drops into both eyes daily as needed (dry eye).     No current facility-administered medications for this visit.    No Known Allergies  Past Medical History  Diagnosis Date  . HTN (hypertension)   . Chronic headaches   . Left leg weakness   . Depression   . Kidney stone 1974    hx of during pregnancy  . Hypothyroidism   . Arthritis     thumbs  . Herpes     has  not had a breakout in a long time  . Myopathy     bilateral legs  . Carotid artery occlusion     Height 5' 6.75" (1.695 m), weight 164 lb 3.2 oz (74.481 kg).   ASSESSMENT AND PLAN:  Tommy Medal PharmD [facility]

## 2014-06-06 NOTE — Telephone Encounter (Signed)
Spoke with pt, discussed with luke kilroy pa, okay given for massage.

## 2014-06-06 NOTE — Op Note (Signed)
Patient has return office visit with Dr Percival Spanish on June 10, 2014 at 9:30 to discuss ETT.

## 2014-06-06 NOTE — Assessment & Plan Note (Signed)
Pt with elevated LDL, unable to take statin drugs due to increased CK and myositis.  Will contact PCP for labs and information on dosing/dates.  Not willing to try ezetimibe due to risk of further muscle damage.  Will start paperwork to get patient enrolled in PCSK-9.  Patient signed paperwork for [facility], once information is gathered will fax request.

## 2014-06-06 NOTE — Telephone Encounter (Signed)
Pt had a positive stress test yesterday. She was told to take it easy,wonder if it will be all right to have a deep muscle massage tomorrow?

## 2014-06-10 ENCOUNTER — Ambulatory Visit (INDEPENDENT_AMBULATORY_CARE_PROVIDER_SITE_OTHER): Payer: BLUE CROSS/BLUE SHIELD | Admitting: Cardiology

## 2014-06-10 ENCOUNTER — Encounter: Payer: Self-pay | Admitting: *Deleted

## 2014-06-10 VITALS — BP 158/82 | HR 72 | Ht 66.75 in | Wt 164.6 lb

## 2014-06-10 DIAGNOSIS — R9439 Abnormal result of other cardiovascular function study: Secondary | ICD-10-CM | POA: Diagnosis not present

## 2014-06-10 NOTE — Patient Instructions (Signed)
Your physician recommends that you schedule a follow-up appointment in: AFTER STRESS TEST  Your physician has requested that you have en exercise stress myoview. For further information please visit [email or web address]. Please follow instruction sheet, as given.

## 2014-06-10 NOTE — Progress Notes (Signed)
Cardiology Office Note   Date:  06/10/2014   ID:  Anne Warner, DOB 25-Dec-1950, MRN [id number]  PCP:  Marylene Land, MD  Cardiologist:   Minus Breeding, MD   Chief Complaint  Patient presents with  . Follow-up    Stress Test      History of Present Illness: Anne Warner is a 64 y.o. female who presents for evaluation of dyslipidemia. She does have a history of peripheral vascular disease with carotid endarterectomy. She has dyslipidemia as a risk factor. She unfortunately has been intolerant of statins has a myopathy and chronically elevated CK that has required extensive workup is quite symptomatic. She has been intolerant of niacin as well. On niacin her LDL most recently was 137.  After our initial meeting I sent her for a Lipid Clinic appointment. She is now being considered for PCSK9 inhibitor.  I did send her for a POET (Plain Old Exercise Treadmill).  I have reviewed these results. There was ST segment depression consistent with a positive result at peak exercise. This became positive at about 4 minutes of exercise with a heart rate of 133 bpm. She had some dyspnea but no chest pain. The ST changes resolved in recovery.  I brought her back to discuss this. She exercises routinely. With this she denies any jaw pain, arm pain, chest discomfort or excessive shortness of breath. She has no resting symptoms. She doesn't report any palpitations, presyncope or syncope. She has no weight gain or edema.  Past Medical History  Diagnosis Date  . HTN (hypertension)   . Chronic headaches   . Left leg weakness   . Depression   . Kidney stone 1974    hx of during pregnancy  . Hypothyroidism   . Arthritis     thumbs  . Herpes     has not had a breakout in a long time  . Myopathy     bilateral legs  . Carotid artery occlusion     Past Surgical History  Procedure Laterality Date  . Colonoscopy    . Appendectomy    . Tonsillectomy and adenoidectomy    . Broken arm       right arm/  with pins  . Endarterectomy Right 07/05/2013    Procedure: ENDARTERECTOMY CAROTID-RIGHT WITH PATCH ANGIOPLASTY;  Surgeon: [name], MD;  Location: [facility] OR;  Service: Vascular;  Laterality: Right;     Current Outpatient Prescriptions  Medication Sig Dispense Refill  . ALPRAZolam (XANAX) 0.5 MG tablet Take 0.5-1 mg by mouth at bedtime.     Marland Kitchen aspirin 81 MG tablet Take 81 mg by mouth daily.    . Calcium Carbonate-Vitamin D (CALTRATE 600+D PO) Take 2 tablets by mouth at bedtime.     . celecoxib (CELEBREX) 200 MG capsule Take 200 mg by mouth 2 (two) times daily.     . chlorthalidone (HYGROTON) 25 MG tablet Take 12.5 mg by mouth daily.     Marland Kitchen levothyroxine (SYNTHROID, LEVOTHROID) 75 MCG tablet Take 75 mcg by mouth daily before breakfast.    . losartan (COZAAR) 50 MG tablet Take 50 mg by mouth daily.    . mupirocin ointment (BACTROBAN) 2 % Place 1 application into the nose at bedtime as needed (dry nose).     Vladimir Faster Glycol-Propyl Glycol (SYSTANE OP) Place 2 drops into both eyes daily as needed (dry eye).     No current facility-administered medications for this visit.    Allergies:   Review of patient's  allergies indicates no known allergies.    ROS:  Please see the history of present illness.   Otherwise, review of systems are positive none.   All other systems are reviewed and negative.    PHYSICAL EXAM: VS:  BP 158/82 mmHg  Pulse 72  Ht 5' 6.75" (1.695 m)  Wt 164 lb 9.6 oz (74.662 kg)  BMI 25.99 kg/m2 , BMI Body mass index is 25.99 kg/(m^2). GENERAL:  Well appearing HEENT:  Pupils equal round and reactive, fundi not visualized, oral mucosa unremarkable NECK:  No jugular venous distention, waveform within normal limits, carotid upstroke brisk and symmetric, no bruits, no thyromegaly LYMPHATICS:  No cervical, inguinal adenopathy LUNGS:  Clear to auscultation bilaterally BACK:  No CVA tenderness CHEST:  Unremarkable HEART:  PMI not displaced or sustained,S1 and S2 within  normal limits, no S3, no S4, no clicks, no rubs, no murmurs ABD:  Flat, positive bowel sounds normal in frequency in pitch, no bruits, no rebound, no guarding, no midline pulsatile mass, no hepatomegaly, no splenomegaly EXT:  2 plus pulses throughout, no edema, no cyanosis no clubbing SKIN:  No rashes no nodules NEURO:  Cranial nerves II through XII grossly intact, motor grossly intact throughout PSYCH:  Cognitively intact, oriented to person place and time    EKG:  EKG is not ordered today.   Recent Labs: 07/04/2013: ALT 55* 07/06/2013: BUN 12; Creatinine 0.66; Hemoglobin 12.7; Platelets 161; Potassium 3.1*; Sodium 139    Lipid Panel No results found for: CHOL, TRIG, HDL, CHOLHDL, VLDL, LDLCALC, LDLDIRECT    Wt Readings from Last 3 Encounters:  06/10/14 164 lb 9.6 oz (74.662 kg)  06/06/14 164 lb 3.2 oz (74.481 kg)  05/07/14 164 lb 12.8 oz (74.753 kg)      Other studies Reviewed: Additional studies/ records that were reviewed today include: POET (Plain Old Exercise Treadmill) Review of the above records demonstrates:  Please see elsewhere in the note.    ASSESSMENT AND PLAN:  DYSLIPIDEMIA:  She will continue follow up with our [facility] TEST:    I reviewed the results of the stress test myself. I discussed at length with the patient. There are no high risk symptoms. In fact she remains completely asymptomatic despite exercising daily. At this point I do not think the next step should be invasive testing but rather we need to risk stratify to see if there are any high risk findings on perfusion imaging. Again we had a long discussion about this versus catheterization. We will proceed next with noninvasive testing and aggressive medical management and continued risk reduction. I did review her medications. For now she will remain on the meds as listed and I will consider further med management with the beta blocker based on the upcoming tests.   Current  medicines are reviewed at length with the patient today.  The patient does not have concerns regarding medicines.  The following changes have been made:  no change  Labs/ tests ordered today include:  Exercise myoview    Disposition:   FU with me after the stress test.    Signed, Minus Breeding, MD  06/10/2014 10:07 AM    [place]

## 2014-06-11 ENCOUNTER — Encounter: Payer: Self-pay | Admitting: Cardiology

## 2014-06-11 ENCOUNTER — Other Ambulatory Visit: Payer: Self-pay | Admitting: Family Medicine

## 2014-06-11 DIAGNOSIS — Z87891 Personal history of nicotine dependence: Secondary | ICD-10-CM

## 2014-06-13 ENCOUNTER — Telehealth (HOSPITAL_COMMUNITY): Payer: Self-pay

## 2014-06-13 NOTE — Telephone Encounter (Signed)
Encounter complete. 

## 2014-06-18 ENCOUNTER — Ambulatory Visit (HOSPITAL_COMMUNITY)
Admission: RE | Admit: 2014-06-18 | Discharge: 2014-06-18 | Disposition: A | Discharge status: Home / Self Care | Payer: BLUE CROSS/BLUE SHIELD | Source: Ambulatory Visit | Attending: Cardiology | Admitting: Cardiology

## 2014-06-18 DIAGNOSIS — R9439 Abnormal result of other cardiovascular function study: Secondary | ICD-10-CM | POA: Insufficient documentation

## 2014-06-18 MED ORDER — TECHNETIUM TC 99M SESTAMIBI GENERIC - CARDIOLITE
32.0000 | Freq: Once | INTRAVENOUS | Status: AC | PRN
  Administered 2014-06-18: 32 via INTRAVENOUS

## 2014-06-18 MED ORDER — TECHNETIUM TC 99M SESTAMIBI GENERIC - CARDIOLITE
10.6000 | Freq: Once | INTRAVENOUS | Status: AC | PRN
  Administered 2014-06-18: 11 via INTRAVENOUS

## 2014-06-18 NOTE — Procedures (Addendum)
[facility] [address] [place] [address] [phone]  Cardiology Nuclear Med Study  Anne Warner is a 64 y.o. female     MRN : [id number]     DOB: [birth year]  Procedure Date: 06/18/2014  Nuclear Med Background Indication for Stress Test:  Evaluation for Ischemia History:  Abnormal ETT, prior endarterectomy, no prior MPI study Cardiac Risk Factors: Carotid Disease, History of Smoking, Hypertension and PVD  Symptoms:  DOE   Nuclear Pre-Procedure Caffeine/Decaff Intake:  1:00am NPO After: 9:00am   IV Site: R Antecubital  IV 0.9% NS with Angio Cath:  22g  Chest Size (in):  n/a IV Started by: Otho Perl, CNMT  Height: 5\' 7"  (1.702 m)  Cup Size: 38DD  BMI:  Body mass index is 25.68 kg/(m^2). Weight:  164 lb (74.39 kg)   Tech Comments:  n/a    Nuclear Med Study 1 or 2 day study: 1 day  Stress Test Type:  Stress  Order Authorizing Provider:  Minus Breeding, MD   Resting Radionuclide: Technetium 61m Sestamibi  Resting Radionuclide Dose: 10.6 mCi   Stress Radionuclide:  Technetium 59m Sestamibi  Stress Radionuclide Dose: 32.0 mCi           Stress Protocol Rest HR: 76 Stress HR:  169  Rest BP: 123/73 Stress BP: 167/82  Exercise Time (min): 7 METS: 8.5   Predicted Max HR: 157 bpm % Max HR: 107.64 bpm Rate Pressure Product: [phone]  Dose of Adenosine (mg):  n/a Dose of Lexiscan: n/a mg  Dose of Atropine (mg): n/a Dose of Dobutamine: n/a mcg/kg/min (at max HR)  Stress Test Technologist: Leane Para, CCT Nuclear Technologist:Elizabeth Young,CNMT   Rest Procedure:  Myocardial perfusion imaging was performed at rest 45 minutes following the intravenous administration of Technetium 24m Sestamibi. Stress Procedure:  The patient performed treadmill exercise using a Bruce  Protocol for 7 minutes. The patient stopped due to SOB and ST depression and denied any chest pain.  There were  significant ST-T wave changes.   Technetium 9m Sestamibi was injected IV at peak exercise and myocardial perfusion imaging was performed after a brief delay.  Transient Ischemic Dilatation (Normal <1.22):  1.31 QGS EDV:  54 ml QGS ESV:  17 ml LV Ejection Fraction: 68%    Rest ECG: Sinus tachycardia with diffuse nonspecific ST changes.  Stress ECG: Significant ST abnormalities consistent with ischemia.  QPS Raw Data Images:  Acquisition technically good; normal left ventricular size. Stress Images:  Normal homogeneous uptake in all areas of the myocardium. Rest Images:  Normal homogeneous uptake in all areas of the myocardium. Subtraction (SDS):  No evidence of ischemia.  Impression Exercise Capacity:  Fair exercise capacity. BP Response:  Normal blood pressure response. Clinical Symptoms:  There is dyspnea. ECG Impression:  Significant ST abnormalities consistent with ischemia. Comparison with Prior Nuclear Study: No images to compare  Overall Impression:  Low risk stress nuclear study with normal perfusion; note significant ST changes..  LV Wall Motion:  NL LV Function; NL Wall Motion   Kirk Ruths, MD  06/18/2014 5:07 PM

## 2014-06-20 ENCOUNTER — Encounter: Payer: Self-pay | Admitting: Cardiology

## 2014-06-20 ENCOUNTER — Encounter: Payer: BLUE CROSS/BLUE SHIELD | Admitting: Cardiology

## 2014-06-20 VITALS — BP 128/76 | HR 64 | Ht 66.0 in | Wt 165.8 lb

## 2014-06-20 NOTE — Progress Notes (Signed)
Error

## 2014-07-02 ENCOUNTER — Ambulatory Visit
Admission: RE | Admit: 2014-07-02 | Discharge: 2014-07-02 | Disposition: A | Discharge status: Home / Self Care | Payer: PRIVATE HEALTH INSURANCE | Source: Ambulatory Visit | Attending: Family Medicine | Admitting: Family Medicine

## 2014-07-02 DIAGNOSIS — Z87891 Personal history of nicotine dependence: Secondary | ICD-10-CM

## 2014-07-17 ENCOUNTER — Ambulatory Visit: Payer: PRIVATE HEALTH INSURANCE | Admitting: Cardiology

## 2014-08-29 ENCOUNTER — Ambulatory Visit
Admission: RE | Admit: 2014-08-29 | Discharge: 2014-08-29 | Disposition: A | Discharge status: Home / Self Care | Payer: BLUE CROSS/BLUE SHIELD | Source: Ambulatory Visit | Attending: Family Medicine | Admitting: Family Medicine

## 2014-08-29 ENCOUNTER — Other Ambulatory Visit: Payer: Self-pay | Admitting: Family Medicine

## 2014-08-29 DIAGNOSIS — M25552 Pain in left hip: Secondary | ICD-10-CM

## 2014-11-12 ENCOUNTER — Other Ambulatory Visit: Payer: Self-pay | Admitting: Pharmacist Clinician (PhC)/ Clinical Pharmacy Specialist

## 2014-11-12 DIAGNOSIS — E785 Hyperlipidemia, unspecified: Secondary | ICD-10-CM

## 2014-11-12 NOTE — Telephone Encounter (Signed)
Pt needs labs updated to apply for [employer]. [employer] now covering for ASCVD

## 2015-01-31 ENCOUNTER — Other Ambulatory Visit: Payer: Self-pay | Admitting: Family Medicine

## 2015-01-31 DIAGNOSIS — M545 Low back pain: Secondary | ICD-10-CM

## 2015-02-04 ENCOUNTER — Encounter: Payer: Self-pay | Admitting: Surgery

## 2015-02-05 ENCOUNTER — Other Ambulatory Visit: Payer: Self-pay | Admitting: *Deleted

## 2015-02-05 DIAGNOSIS — I6523 Occlusion and stenosis of bilateral carotid arteries: Secondary | ICD-10-CM

## 2015-02-10 ENCOUNTER — Ambulatory Visit (INDEPENDENT_AMBULATORY_CARE_PROVIDER_SITE_OTHER): Payer: BLUE CROSS/BLUE SHIELD | Admitting: Surgery

## 2015-02-10 ENCOUNTER — Ambulatory Visit (HOSPITAL_COMMUNITY)
Admission: RE | Admit: 2015-02-10 | Discharge: 2015-02-10 | Disposition: A | Discharge status: Home / Self Care | Payer: BLUE CROSS/BLUE SHIELD | Source: Ambulatory Visit | Attending: Surgery | Admitting: Surgery

## 2015-02-10 ENCOUNTER — Ambulatory Visit
Admission: RE | Admit: 2015-02-10 | Discharge: 2015-02-10 | Disposition: A | Discharge status: Home / Self Care | Payer: BLUE CROSS/BLUE SHIELD | Source: Ambulatory Visit | Attending: Family Medicine | Admitting: Family Medicine

## 2015-02-10 ENCOUNTER — Other Ambulatory Visit: Payer: Self-pay | Admitting: Family Medicine

## 2015-02-10 ENCOUNTER — Encounter: Payer: Self-pay | Admitting: Surgery

## 2015-02-10 VITALS — BP 159/92 | HR 73 | Ht 66.0 in | Wt 163.6 lb

## 2015-02-10 DIAGNOSIS — I6523 Occlusion and stenosis of bilateral carotid arteries: Secondary | ICD-10-CM | POA: Diagnosis not present

## 2015-02-10 DIAGNOSIS — M546 Pain in thoracic spine: Secondary | ICD-10-CM

## 2015-02-10 DIAGNOSIS — M898X1 Other specified disorders of bone, shoulder: Secondary | ICD-10-CM

## 2015-02-10 DIAGNOSIS — I10 Essential (primary) hypertension: Secondary | ICD-10-CM | POA: Insufficient documentation

## 2015-02-10 DIAGNOSIS — I6522 Occlusion and stenosis of left carotid artery: Secondary | ICD-10-CM | POA: Insufficient documentation

## 2015-02-10 DIAGNOSIS — M545 Low back pain: Secondary | ICD-10-CM

## 2015-02-10 DIAGNOSIS — I6521 Occlusion and stenosis of right carotid artery: Secondary | ICD-10-CM | POA: Diagnosis not present

## 2015-02-10 NOTE — Progress Notes (Signed)
Patient name: Anne Warner MRN: [id number] DOB: 23-Dec-1950 Sex: female     Chief Complaint  Patient presents with  . Re-evaluation    1 year f/u - c/o numbness R side of neck    HISTORY OF PRESENT ILLNESS: The patient is status post right carotid endarterectomy with patch angioplasty on 07/05/2013. This was done in the setting of asymptomatic right carotid stenosis. Intraoperative findings included 90% stenosis. The patient was discharged to home on postoperative day 1. She did have a right marginal mandibular neurapraxia, which has resolved. She reports no new neurologic deficits.  She does complain of numbness around her incision  Past Medical History  Diagnosis Date  . HTN (hypertension)   . Chronic headaches   . Left leg weakness   . Depression   . Kidney stone 1974    hx of during pregnancy  . Hypothyroidism   . Arthritis     thumbs  . Herpes     has not had a breakout in a long time  . Myopathy     bilateral legs  . Carotid artery occlusion     Past Surgical History  Procedure Laterality Date  . Colonoscopy    . Appendectomy    . Tonsillectomy and adenoidectomy    . Broken arm       right arm/ with pins  . Endarterectomy Right 07/05/2013    Procedure: ENDARTERECTOMY CAROTID-RIGHT WITH PATCH ANGIOPLASTY;  Surgeon: [name], MD;  Location: [facility] OR;  Service: Vascular;  Laterality: Right;  . Refractive surgery      torn retina    Social History   Social History  . Marital Status: Divorced    Spouse Name: N/A  . Number of Children: 1  . Years of Education: N/A   Occupational History  . Retired from the [employer].      Social History Main Topics  . Smoking status: Former Smoker -- 1.00 packs/day for 40 years    Types: Cigarettes    Quit date: 03/16/2007  . Smokeless tobacco: Never Used     Comment: Quit with Chantix  . Alcohol Use: 4.2 oz/week    7 Standard drinks or equivalent per week  . Drug Use: No  . Sexual Activity: Not on file    Other Topics Concern  . Not on file   Social History Narrative   Lives alone.      Family History  Problem Relation Age of Onset  . Hypertension Mother   . Varicose Veins Mother   . Diabetes Father   . Hyperlipidemia Father   . Diabetes Brother   . Hypertension Brother   . Peripheral vascular disease Father     Stroke.  Died from complicaitons of endarterectomy    Allergies as of 02/10/2015  . (No Known Allergies)    Current Outpatient Prescriptions on File Prior to Visit  Medication Sig Dispense Refill  . ALPRAZolam (XANAX) 0.5 MG tablet Take 0.5-1 mg by mouth at bedtime.     Marland Kitchen aspirin 81 MG tablet Take 81 mg by mouth daily.    . Calcium Carbonate-Vitamin D (CALTRATE 600+D PO) Take 2 tablets by mouth at bedtime.     . celecoxib (CELEBREX) 200 MG capsule Take 200 mg by mouth 2 (two) times daily.     Marland Kitchen levothyroxine (SYNTHROID, LEVOTHROID) 75 MCG tablet Take 75 mcg by mouth daily before breakfast.    . losartan (COZAAR) 50 MG tablet Take 50 mg by mouth daily.    Marland Kitchen  metFORMIN (GLUCOPHAGE-XR) 500 MG 24 hr tablet Take 500 mg by mouth 2 (two) times daily.  3  . mupirocin ointment (BACTROBAN) 2 % Place 1 application into the nose at bedtime as needed (dry nose).     Vladimir Faster Glycol-Propyl Glycol (SYSTANE OP) Place 2 drops into both eyes daily as needed (dry eye).    . chlorthalidone (HYGROTON) 25 MG tablet Take 12.5 mg by mouth daily.      No current facility-administered medications on file prior to visit.     REVIEW OF SYSTEMS: Cardiovascular: No chest pain, chest pressure, palpitations, orthopnea, or dyspnea on exertion. No claudication or rest pain,  No history of DVT or phlebitis. Pulmonary: No productive cough, asthma or wheezing. Neurologic: No weakness, paresthesias, aphasia, or amaurosis. No dizziness. Hematologic: No bleeding problems or clotting disorders. Musculoskeletal: No joint pain or joint swelling. Gastrointestinal: No blood in stool or  hematemesis Genitourinary: No dysuria or hematuria. Psychiatric:: No history of major depression. Integumentary: No rashes or ulcers. Constitutional: No fever or chills.  PHYSICAL EXAMINATION:   Vital signs are  Filed Vitals:   02/10/15 1145 02/10/15 1148  BP: 154/95 159/92  Pulse: 73   Height: 5\' 6"  (1.676 m)   Weight: 163 lb 9.6 oz (74.208 kg)   SpO2: 98%    Body mass index is 26.42 kg/(m^2). General: The patient appears their stated age. HEENT:  No gross abnormalities Pulmonary:  Non labored breathing Musculoskeletal: There are no major deformities. Neurologic: No focal weakness or paresthesias are detected, Skin: There are no ulcer or rashes noted. Psychiatric: The patient has normal affect. Cardiovascular: There is a regular rate and rhythm without significant murmur appreciated. No carotid bruit   Diagnostic Studies  I have reviewed her ultrasound which shows no evidence of restenosis on the right carotid endarterectomy site.  There is less than 40% left internal carotid stenosis  Assessment:  status post right carotid endarterectomy Plan:  duplex shows widely patent bilateral carotid arteries.  The patient would like to follow up in 2 years with a repeat duplex  V. Leia Alf, M.D. [facility] Office: [phone] Pager:  [phone]

## 2015-04-09 IMAGING — CR DG CHEST 2V
2 series · 2 of 2 positions shown · non-contrast
Comparison: None.

CLINICAL DATA: Hypertension.

EXAM:
CHEST  2 VIEW

[w chest pa]
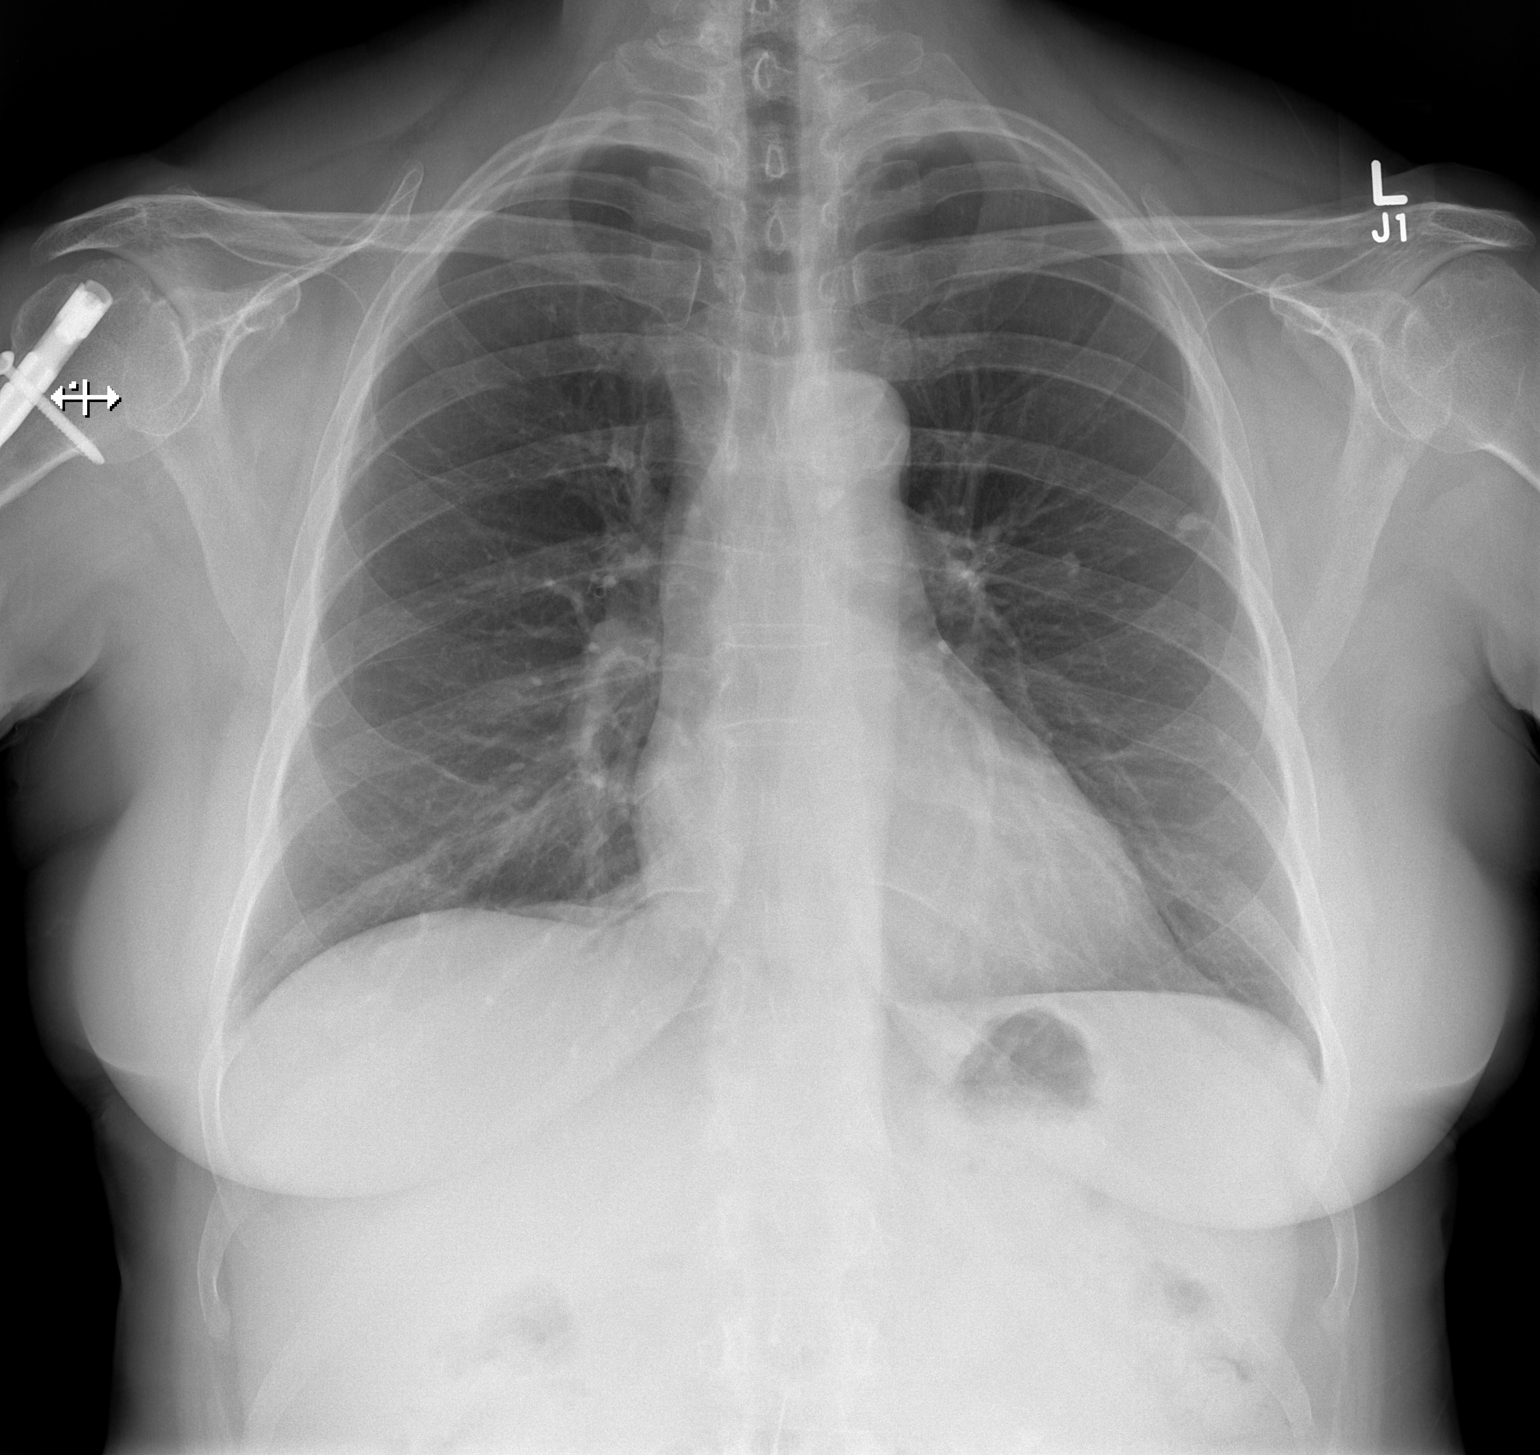

[w chest lat]
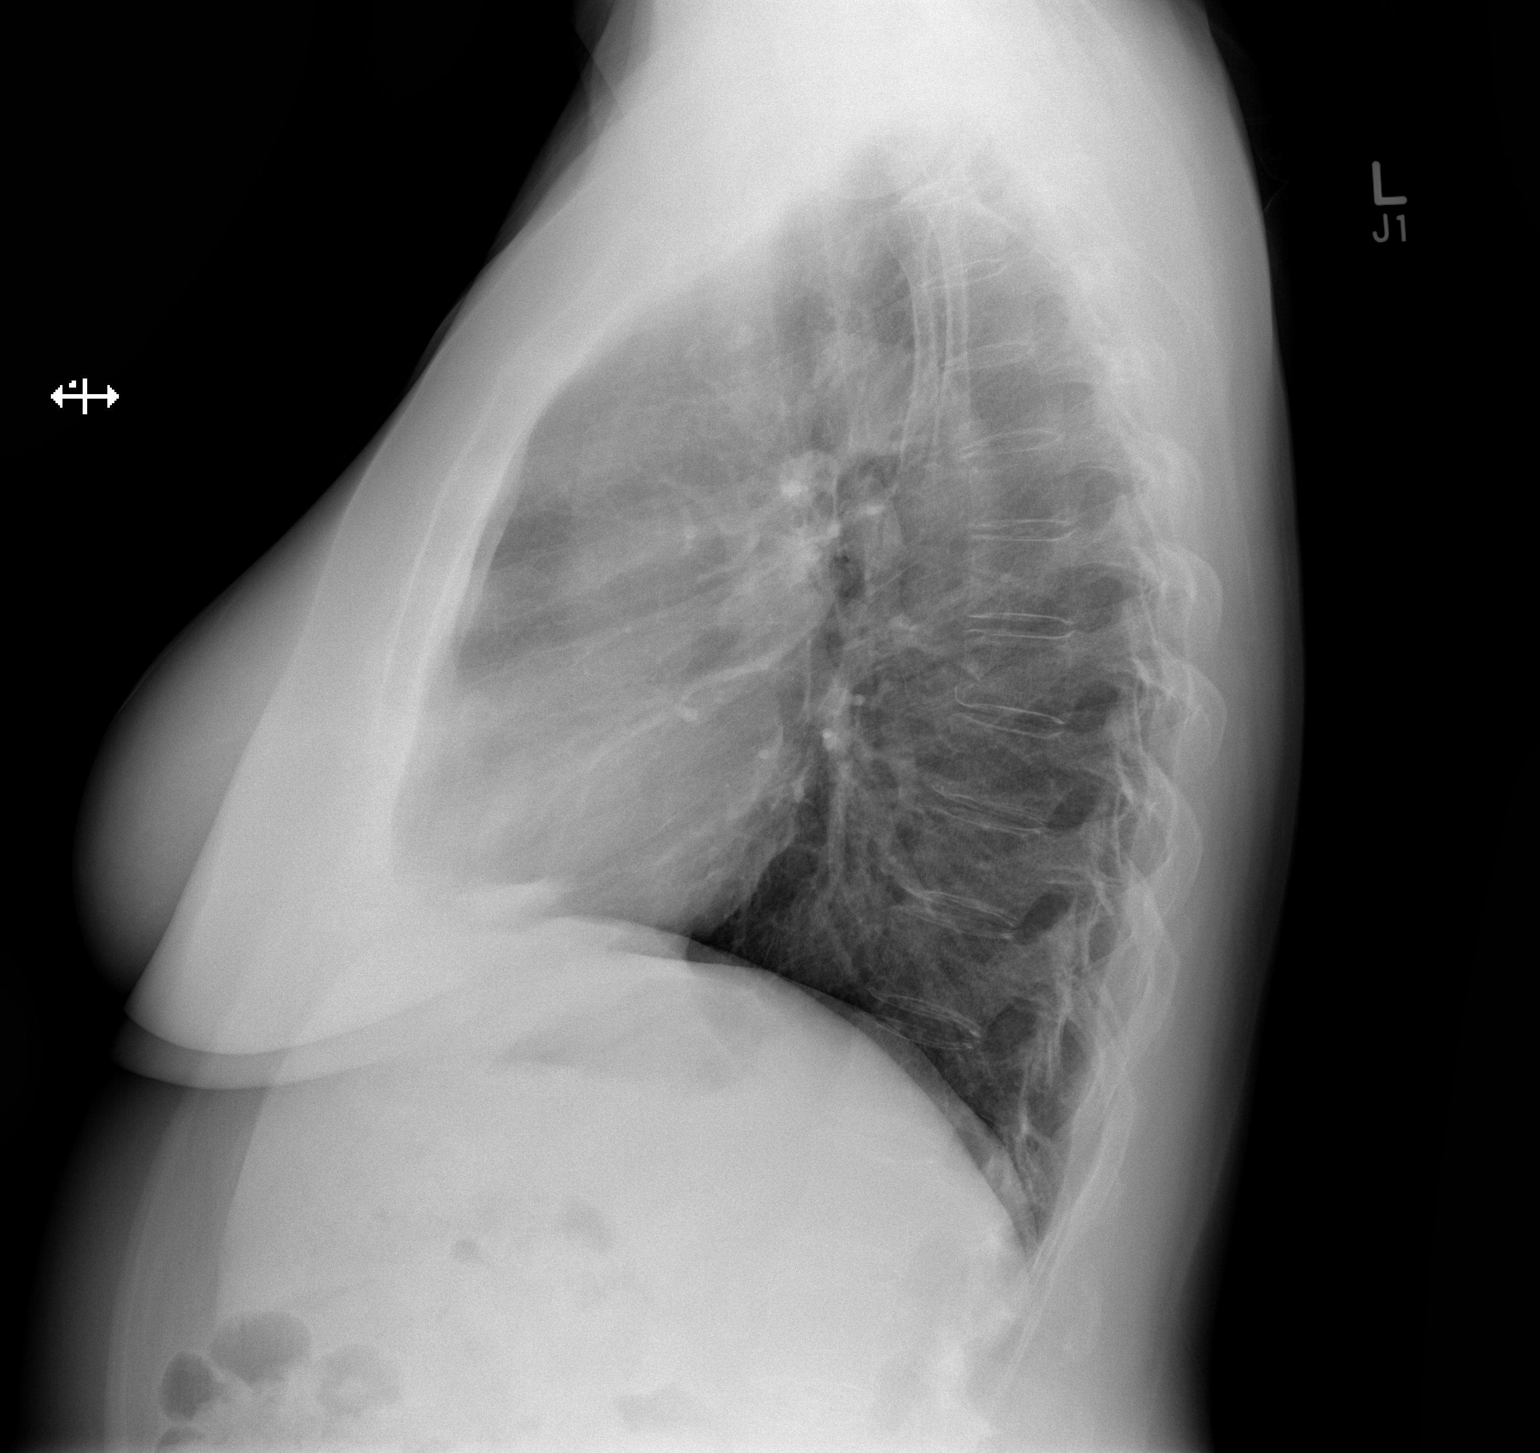

[2 of 2 positions shown; findings below may reference images not displayed]

FINDINGS: The heart size and mediastinal contours are within normal limits.
Several calcified granulomata are noted in the left midlung. No
acute pulmonary disease is noted. No pneumothorax or pleural
effusion is noted. Status post surgical fixation of proximal right
humerus.
IMPRESSION: No acute cardiopulmonary abnormality seen.

## 2015-06-03 ENCOUNTER — Other Ambulatory Visit: Payer: Self-pay | Admitting: Acute Care

## 2015-06-03 DIAGNOSIS — Z87891 Personal history of nicotine dependence: Secondary | ICD-10-CM

## 2015-06-23 ENCOUNTER — Encounter: Payer: Self-pay | Admitting: Cardiology

## 2015-07-04 ENCOUNTER — Ambulatory Visit: Payer: BLUE CROSS/BLUE SHIELD

## 2015-12-15 ENCOUNTER — Other Ambulatory Visit: Payer: Self-pay | Admitting: Family Medicine

## 2015-12-15 ENCOUNTER — Ambulatory Visit
Admission: RE | Admit: 2015-12-15 | Discharge: 2015-12-15 | Disposition: A | Discharge status: Home / Self Care | Payer: Medicare Other | Source: Ambulatory Visit | Attending: Family Medicine | Admitting: Family Medicine

## 2015-12-15 DIAGNOSIS — R079 Chest pain, unspecified: Secondary | ICD-10-CM

## 2015-12-30 ENCOUNTER — Encounter: Payer: Self-pay | Admitting: *Deleted

## 2015-12-30 ENCOUNTER — Telehealth: Payer: Self-pay | Admitting: Cardiology

## 2015-12-30 NOTE — Telephone Encounter (Signed)
Closed encounter °

## 2015-12-30 NOTE — Telephone Encounter (Signed)
Received records from Dr Derinda Late for appointment on 02/16/16 with Dr Percival Spanish.  Records given to [facility] (medical records) for Dr Hochrein's schedule on 02/16/16. lp

## 2016-01-02 ENCOUNTER — Encounter: Payer: Self-pay | Admitting: Gastroenterology

## 2016-01-08 ENCOUNTER — Ambulatory Visit: Payer: BLUE CROSS/BLUE SHIELD | Admitting: Cardiology

## 2016-02-16 ENCOUNTER — Ambulatory Visit: Payer: Medicare Other | Admitting: Cardiology

## 2016-02-27 ENCOUNTER — Other Ambulatory Visit: Payer: Self-pay | Admitting: Obstetrics and Gynecology

## 2016-02-27 DIAGNOSIS — E2839 Other primary ovarian failure: Secondary | ICD-10-CM

## 2016-03-09 NOTE — Progress Notes (Signed)
Cardiology Office Note   Date:  03/10/2016   ID:  Anne Warner, DOB [birth year], MRN [id number]  PCP:  Anne Land, MD  Cardiologist:   Minus Breeding, MD   Chief Complaint  Patient presents with  . Follow-up    SYNCOPE     History of Present Illness: Anne Warner is a 65 y.o. female who presents for evaluation of dyslipidemia. She does have a history of peripheral vascular disease with carotid endarterectomy. She has dyslipidemia as a risk factor. She unfortunately has been intolerant of statins has a myopathy and chronically elevated CK that has required extensive workup is quite symptomatic. She has been intolerant of niacin as well. On niacin her LDL most recently was 137.  After our initial meeting I sent her for a Lipid Clinic appointment. I did send her for a POET (Plain Old Exercise Treadmill).  There was ST segment depression consistent with a positive result at peak exercise. This became positive at about 4 minutes of exercise with a heart rate of 133 bpm. She had some dyspnea but no chest pain. The ST changes resolved in recovery.  She had perfusion imaging with negative results.    She returns for follow-up of her vascular disease but also for follow-up of 2 syncopal episodes. One of these occurred in June. He had sudden syncope with facial trauma. I was able to review some records. She was hospitalized overnight at an outside hospital. She was noted to be hypokalemic. She was thought maybe to be dehydrated. CT of the brain demonstrated no acute abnormalities except for a right orbital fracture. She did well after this until about a month ago. She was teaching some aerobics. She had a presyncopal episode in which she became hot and then cold and clammy. She ate some food dehydrated she recovered. At that time she did not seek any medical treatment. Of note during her initial treatment she was taken off of losartan. She remained on chlorthalidone. She's had no further events. He  otherwise feels well. She denies any chest discomfort, neck or arm discomfort. He's had no shortness of breath, PND or orthopnea.  I brought her back to discuss this. She exercises routinely. With this she denies any jaw pain, arm pain, chest discomfort or excessive shortness of breath. She has no resting symptoms. She doesn't report any palpitations, presyncope or syncope. She has no weight gain or edema.  Past Medical History:  Diagnosis Date  . Arthritis    thumbs  . Carotid artery occlusion   . Chronic headaches   . Depression   . Herpes    has not had a breakout in a long time  . HTN (hypertension)   . Hypothyroidism   . Kidney stone 1974   hx of during pregnancy  . Left leg weakness   . Myopathy    bilateral legs    Past Surgical History:  Procedure Laterality Date  . APPENDECTOMY    . broken arm      right arm/ with pins  . COLONOSCOPY    . ENDARTERECTOMY Right 07/05/2013   Procedure: ENDARTERECTOMY CAROTID-RIGHT WITH PATCH ANGIOPLASTY;  Surgeon: [name], MD;  Location: [facility];  Service: Vascular;  Laterality: Right;  . REFRACTIVE SURGERY     torn retina  . TONSILLECTOMY AND ADENOIDECTOMY       Current Outpatient Prescriptions  Medication Sig Dispense Refill  . ALPRAZolam (XANAX) 0.5 MG tablet Take 0.5-1 mg by mouth at bedtime.     Marland Kitchen  aspirin 81 MG tablet Take 81 mg by mouth daily.    . Calcium Carbonate-Vitamin D (CALTRATE 600+D PO) Take 2 tablets by mouth at bedtime.     . celecoxib (CELEBREX) 200 MG capsule Take 200 mg by mouth 2 (two) times daily.     . chlorthalidone (HYGROTON) 25 MG tablet Take 12.5 mg by mouth daily.     . cyclobenzaprine (FLEXERIL) 5 MG tablet TAKE 1 TO 2 TABLETS BY MOUTH EVERY 8 HOURS AS NEEDED FOR MUSCLE SPASM  2  . levothyroxine (SYNTHROID, LEVOTHROID) 75 MCG tablet Take 75 mcg by mouth daily before breakfast.    . losartan (COZAAR) 50 MG tablet Take 50 mg by mouth daily.    . metFORMIN (GLUCOPHAGE-XR) 500 MG 24 hr tablet Take 500  mg by mouth 2 (two) times daily.  3  . mupirocin ointment (BACTROBAN) 2 % Place 1 application into the nose at bedtime as needed (dry nose).     Vladimir Faster Glycol-Propyl Glycol (SYSTANE OP) Place 2 drops into both eyes daily as needed (dry eye).     No current facility-administered medications for this visit.     Allergies:   Patient has no known allergies.    ROS:  Please see the history of present illness.   Otherwise, review of systems are positive none.   All other systems are reviewed and negative.    PHYSICAL EXAM: VS:  BP 124/72   Pulse 94   Ht 5\' 6"  (1.676 m)   Wt 156 lb (70.8 kg)   BMI 25.18 kg/m  , BMI Body mass index is 25.18 kg/m. GENERAL:  Well appearing NECK:  No jugular venous distention, waveform within normal limits, carotid upstroke brisk and symmetric, no bruits, no thyromegaly LUNGS:  Clear to auscultation bilaterally BACK:  No CVA tenderness CHEST:  Unremarkable HEART:  PMI not displaced or sustained,S1 and S2 within normal limits, no S3, no S4, no clicks, no rubs, no murmurs ABD:  Flat, positive bowel sounds normal in frequency in pitch, no bruits, no rebound, no guarding, no midline pulsatile mass, no hepatomegaly, no splenomegaly EXT:  2 plus pulses throughout, no edema, no cyanosis no clubbing SKIN:  No rashes no nodules   EKG:  EKG is ordered today. Sinus rhythm, rate 94, axis within normal limits, intervals within normal limits, no acute ST-T wave changes, or anterior R wave progression.  Recent Labs: No results found for requested labs within last 8760 hours.    Lipid Panel No results found for: CHOL, TRIG, HDL, CHOLHDL, VLDL, LDLCALC, LDLDIRECT    Wt Readings from Last 3 Encounters:  03/10/16 156 lb (70.8 kg)  02/10/15 163 lb 9.6 oz (74.2 kg)  06/20/14 165 lb 12.8 oz (75.2 kg)      Other studies Reviewed: Additional studies/ records that were reviewed today include: Outside hospital records and office record Review of the above records  demonstrates:  Please see elsewhere in the note.    ASSESSMENT AND PLAN:  DYSLIPIDEMIA:  She has been tolerating Crestor since the spring. She is due to have a lipid profile done. This will be followed by Anne Land, MD   ABNORMAL STRESS TEST:     The patient had a negative perfusion image following a positive POET (Plain Old Exercise Treadmill).  She has no symptoms suggestive of ischemia.  No further testing is indicated.   SYNCOPE:   The second episode sounds like it could've been a vagal. The first episode sounds more like dehydration with  hypokalemia. She's not had any recurrent episodes. I don't think an event monitor would be revealing. We talked about avoidance she have any prodrome in the future. She would certainly need to let me know if she has any symptoms. At this point no change in therapy is indicated.  Current medicines are reviewed at length with the patient today.  The patient does not have concerns regarding medicines.  The following changes have been made:   None  Labs/ tests ordered today include:   None   Disposition:   FU with me in six months.     Signed, Minus Breeding, MD  03/10/2016 3:52 PM    [facility]

## 2016-03-10 ENCOUNTER — Encounter: Payer: Self-pay | Admitting: Cardiology

## 2016-03-10 ENCOUNTER — Ambulatory Visit
Admission: RE | Admit: 2016-03-10 | Discharge: 2016-03-10 | Disposition: A | Discharge status: Home / Self Care | Payer: Medicare Other | Source: Ambulatory Visit | Attending: Obstetrics and Gynecology | Admitting: Obstetrics and Gynecology

## 2016-03-10 ENCOUNTER — Other Ambulatory Visit: Payer: Medicare Other

## 2016-03-10 ENCOUNTER — Ambulatory Visit (INDEPENDENT_AMBULATORY_CARE_PROVIDER_SITE_OTHER): Payer: Medicare Other | Admitting: Cardiology

## 2016-03-10 VITALS — BP 124/72 | HR 94 | Ht 66.0 in | Wt 156.0 lb

## 2016-03-10 DIAGNOSIS — E2839 Other primary ovarian failure: Secondary | ICD-10-CM

## 2016-03-10 DIAGNOSIS — R55 Syncope and collapse: Secondary | ICD-10-CM | POA: Diagnosis not present

## 2016-03-10 NOTE — Patient Instructions (Signed)
Medication Instructions:  Continue current medications  Labwork: None Ordered  Testing/Procedures: None Ordered  Follow-Up: Your physician wants you to follow-up in: 6 Months. You will receive a reminder letter in the mail two months in advance. If you don't receive a letter, please call our office to schedule the follow-up appointment.   Any Other Special Instructions Will Be Listed Below (If Applicable).  HAPPY NEW YEARS   If you need a refill on your cardiac medications before your next appointment, please call your pharmacy.

## 2016-11-15 IMAGING — CR DG SCAPULA*R*
2 series · 2 of 2 positions shown · non-contrast
Comparison: Chest CT of 07/02/2014

CLINICAL DATA: Right scapular pain for months without trauma.

EXAM:
RIGHT SCAPULA - 2+ VIEWS

[w scapula ap right]
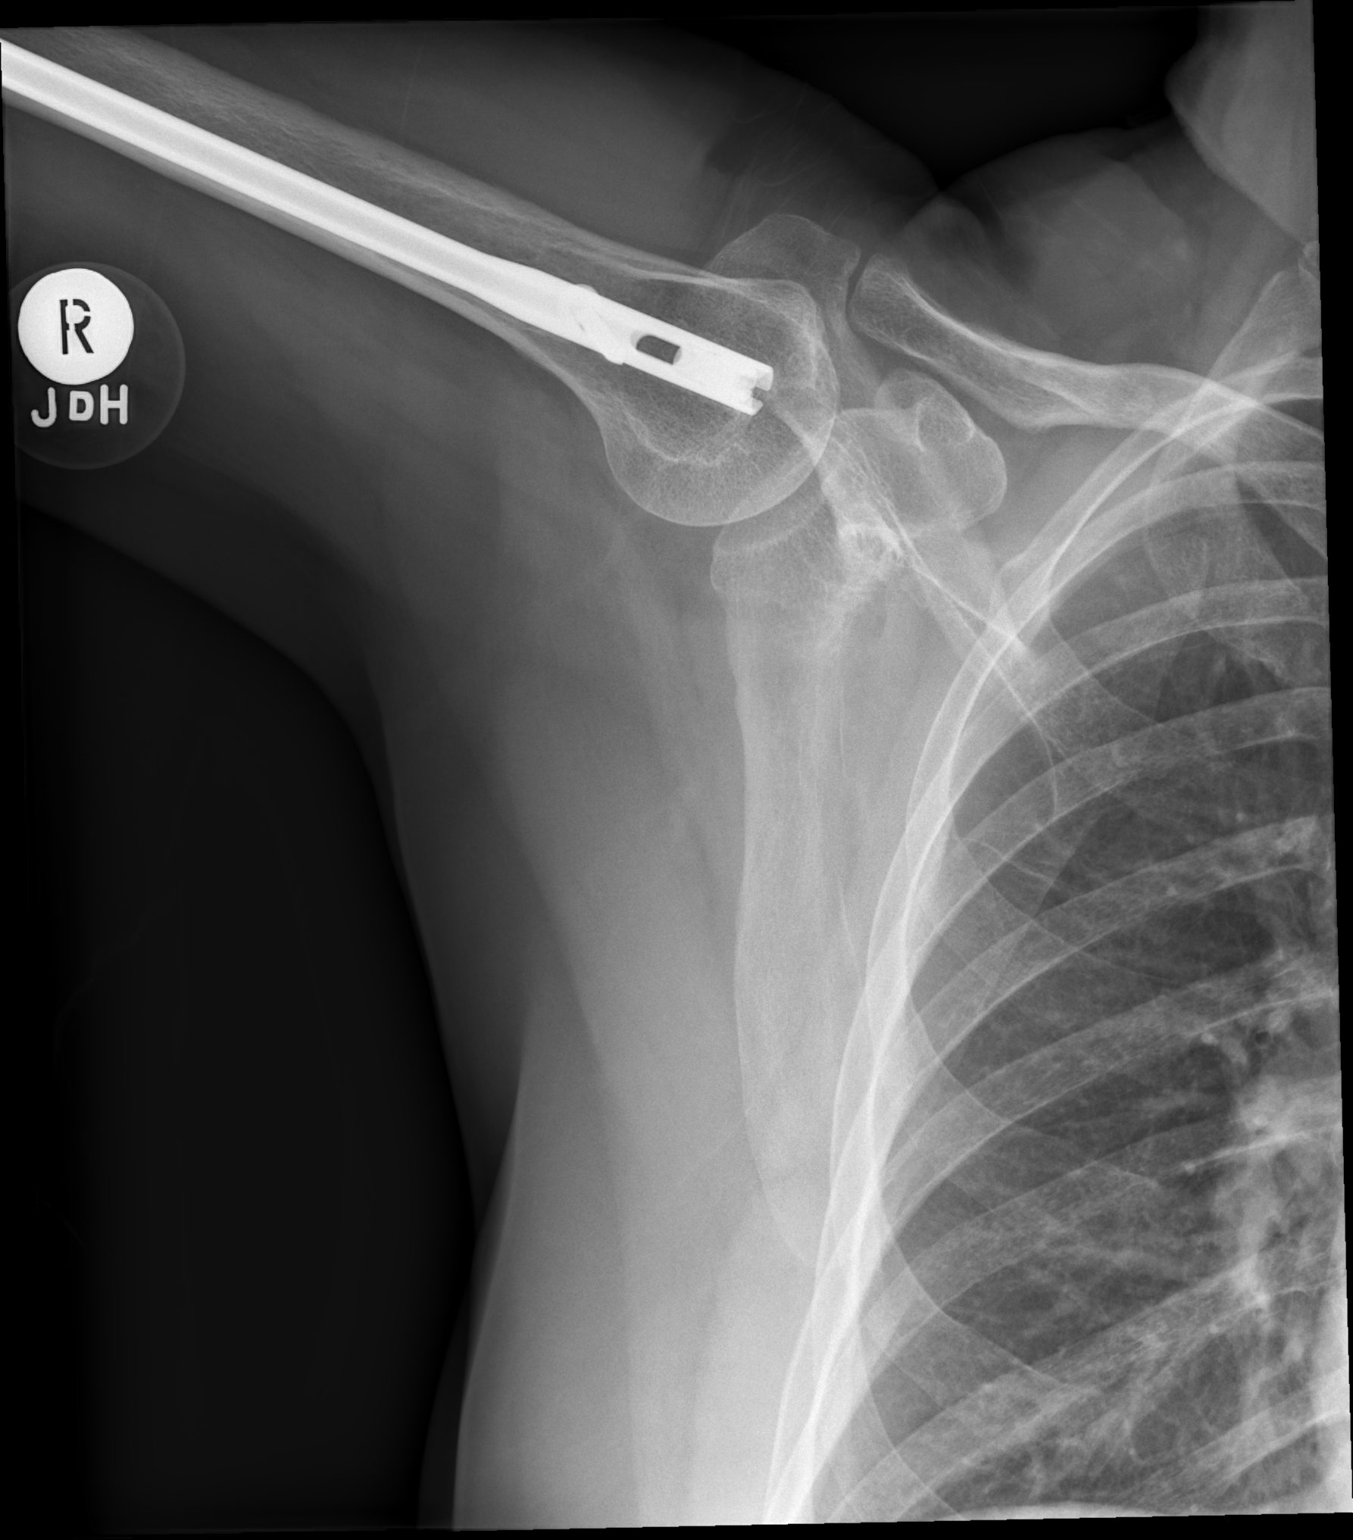

[w scapula y-view right]
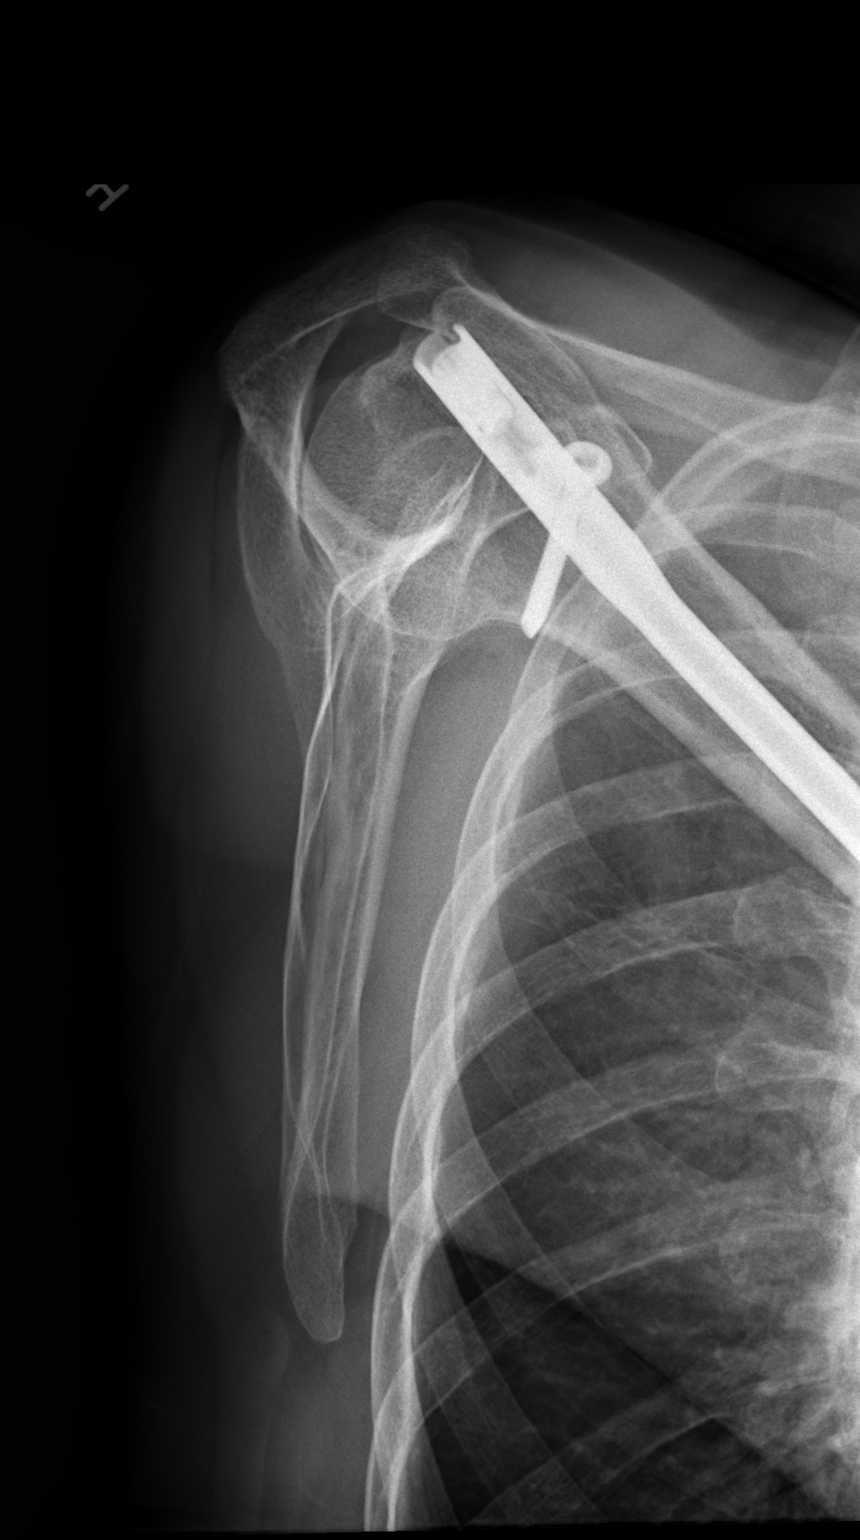

[2 of 2 positions shown; findings below may reference images not displayed]

FINDINGS: Status post proximal right humerus fixation. Visualized portion of
the right hemithorax is normal. No acute fracture or dislocation. No
acute hardware complication.
IMPRESSION: No acute osseous abnormality.

## 2016-11-15 IMAGING — MR MR LUMBAR SPINE W/O CM
5 series · 32 of 48 positions shown · non-contrast
Comparison: 02/23/2010

CLINICAL DATA: 64-year-old female low back pain and bilateral leg
pain/weakness. No injury. No history of cancer or surgery.
Subsequent encounter.

EXAM:
MRI LUMBAR SPINE WITHOUT CONTRAST
TECHNIQUE: Multiplanar, multisequence MR imaging of the lumbar spine was
performed. No intravenous contrast was administered.

[Series 2: T2 post-contrast · sagittal · 4.0mm · 0.88mm/px · 6 of 12 slices shown]
[im 1/12]
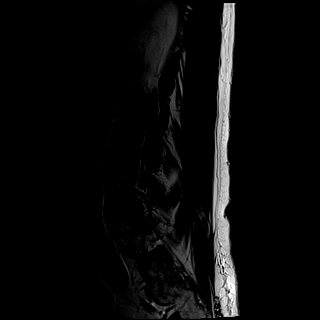
[im 3/12]
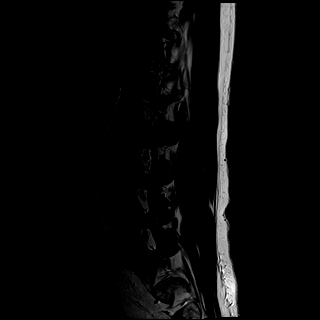
[im 5/12]
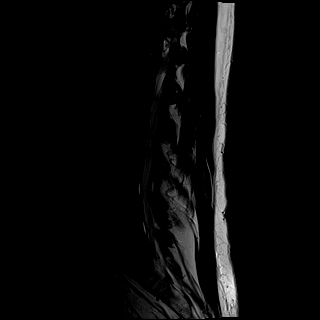
[im 7/12]
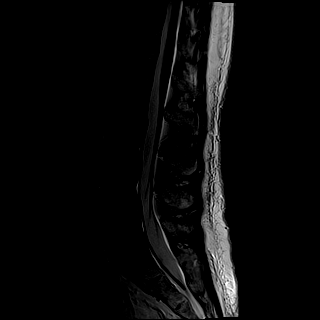
[im 9/12]
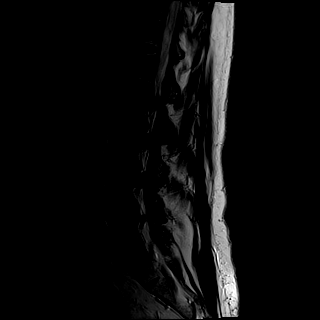
[im 12/12]
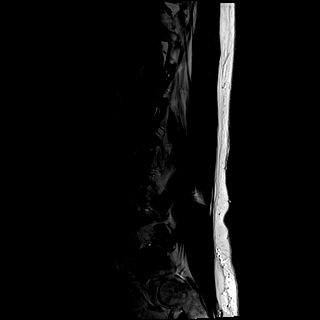

[Series 3: T1 · sagittal · 4.0mm · 0.88mm/px · 6 of 12 slices shown (1 of 2)]
[im 1/12]
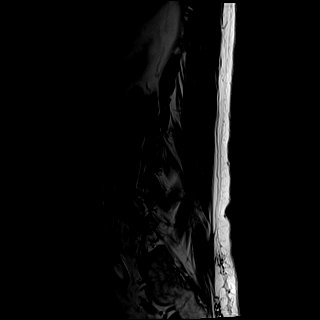
[im 3/12]
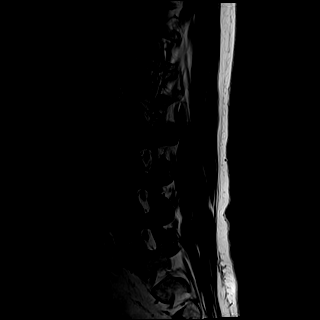
[im 5/12]
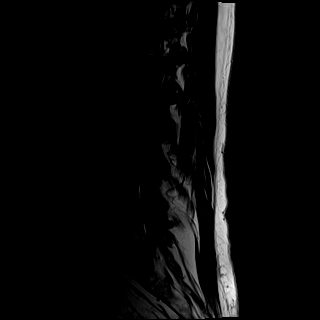
[im 7/12]
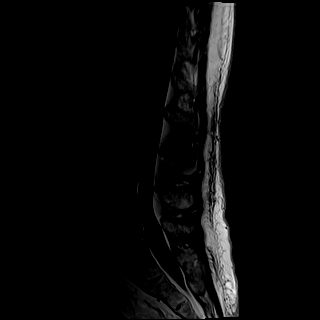
[im 9/12]
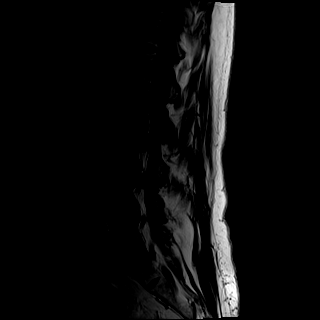
[im 12/12]
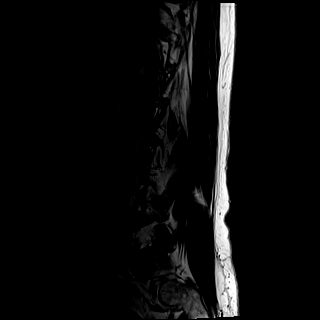

[Series 4: T1 · axial · 4.0mm · 0.37mm/px · z∈[-92,+105]mm · 9 of 32 slices shown (2 of 2)]
[im 1/32]
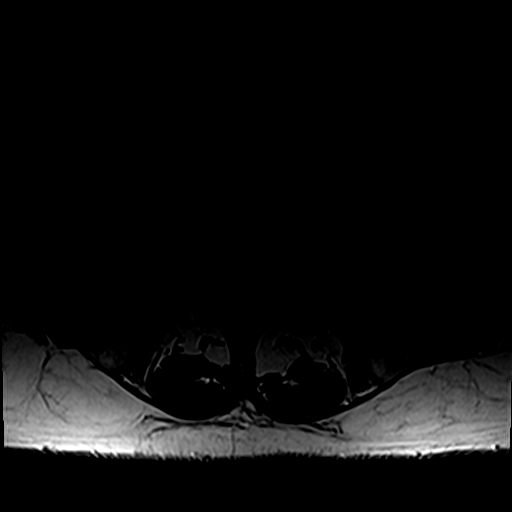
[im 5/32]
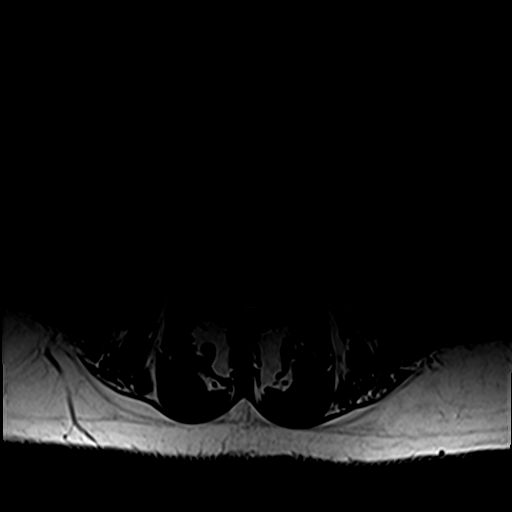
[im 9/32]
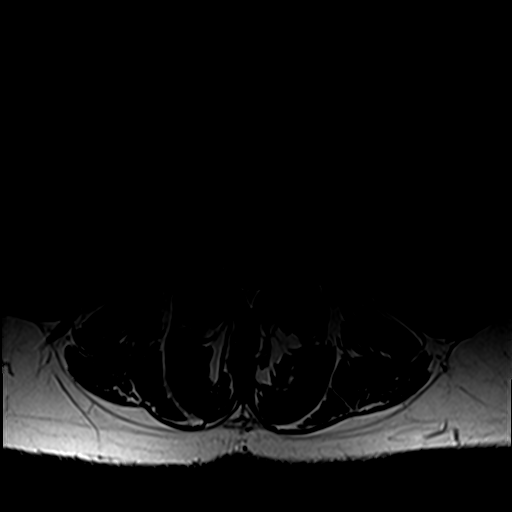
[im 14/32]
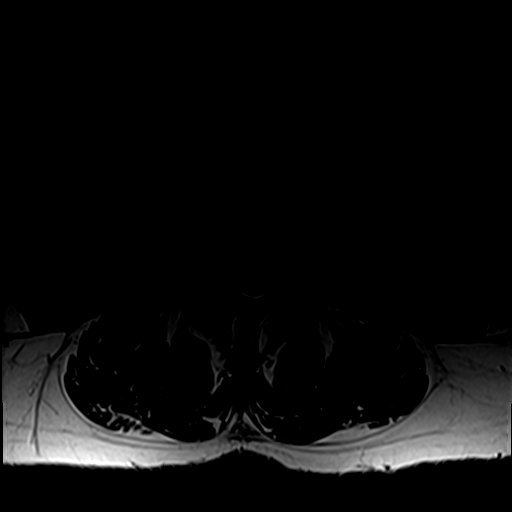
[im 16/32]
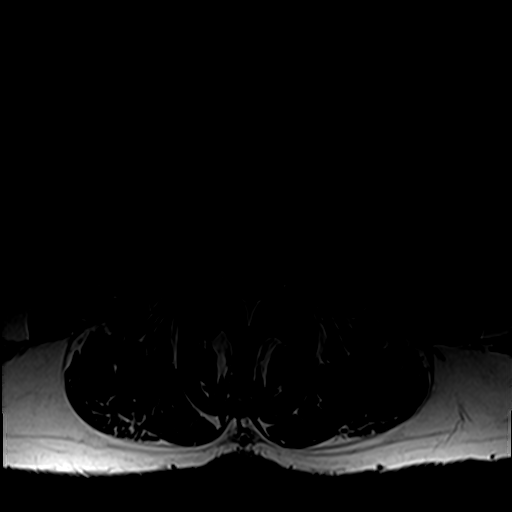
[im 18/32]
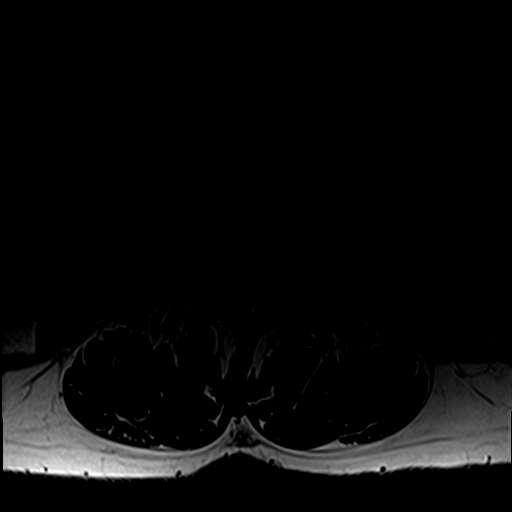
[im 23/32]
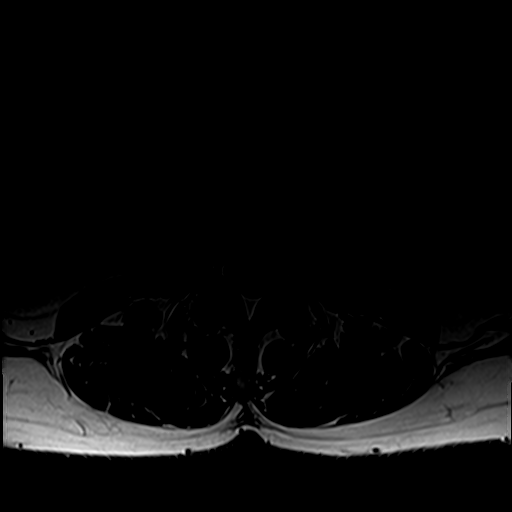
[im 27/32]
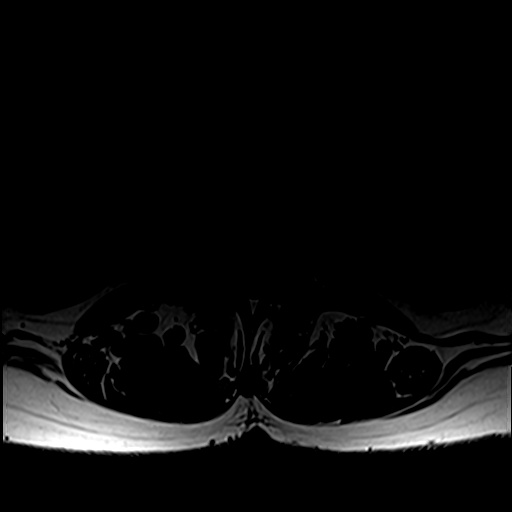
[im 32/32]
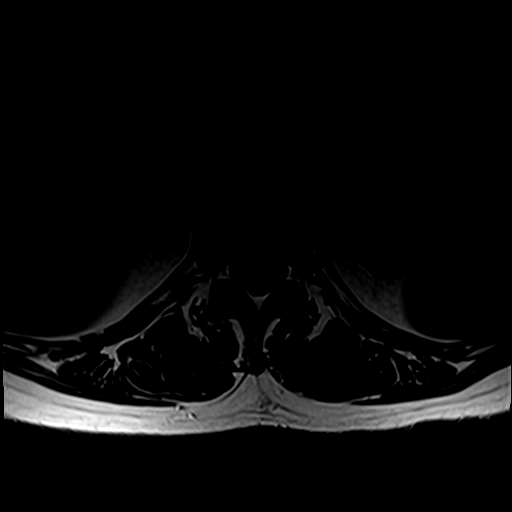

[Series 5: STIR · sagittal · 4.0mm · 0.55mm/px · 2 of 12 slices shown]
[im 1/12]
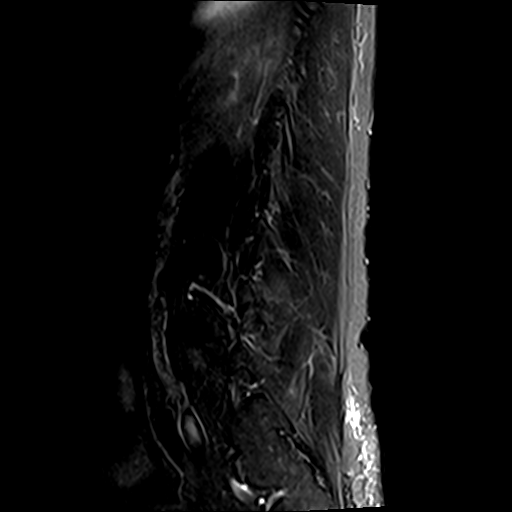
[im 3/12]
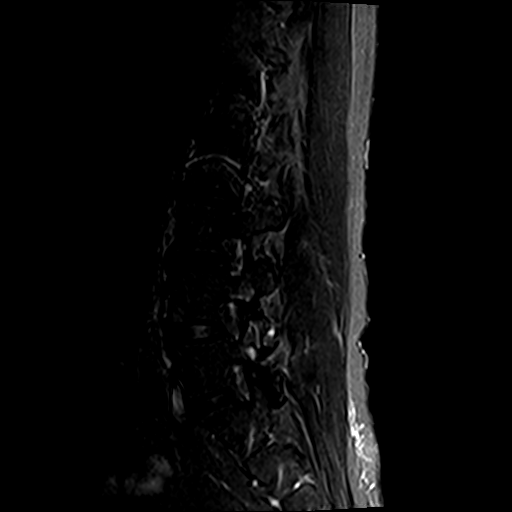

[Series 6: T2 · axial · 4.0mm · 0.74mm/px · z∈[-92,+105]mm · 9 of 32 slices shown]
[im 1/32]
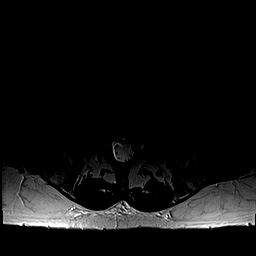
[im 5/32]
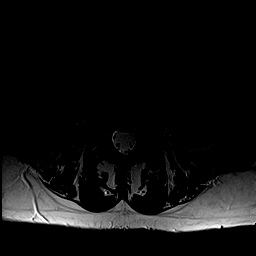
[im 9/32]
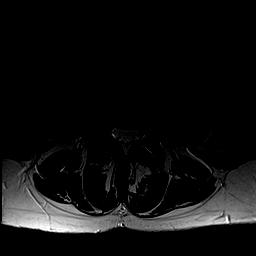
[im 14/32]
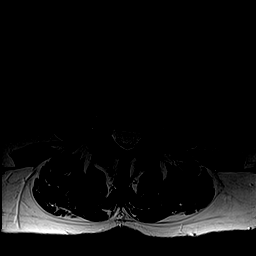
[im 16/32]
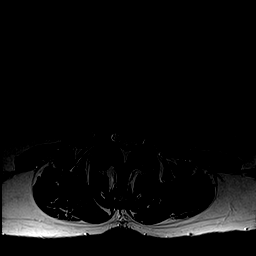
[im 18/32]
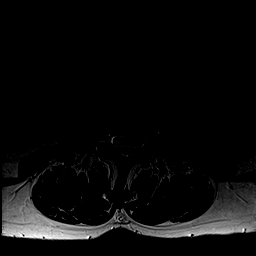
[im 23/32]
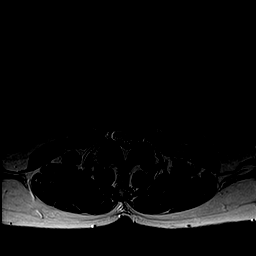
[im 27/32]
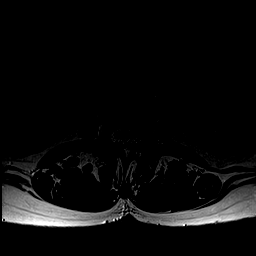
[im 32/32]
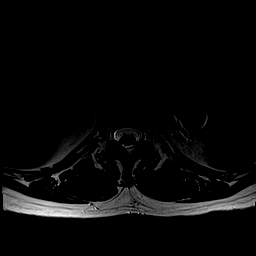

[32 of 48 positions shown; findings below may reference images not displayed]

FINDINGS: Last fully open disk space is labeled L5-S1. Present examination
incorporates from T11-12 disc space through the S3 level. Conus
upper L2 level.

Visualized paravertebral structures unremarkable.

T11-12: Negative.

T12-L1: Minimal Schmorl's node deformity without change. Minimal
facet joint degenerative changes greater on left.

L1-2:  Minimal facet joint degenerative changes.

L2-3: Minimal disc degeneration. Minimal bulge greater to the right.
Mild facet joint degenerative changes greater on left.

L3-4: Minimal disc degeneration. Minimal bulge. Mild facet joint
degenerative changes.

L4-5: Prominent facet joint degenerative changes greater on the
left. Minimal bulge.

L5-S1:  Moderate facet joint degenerative changes.
IMPRESSION: No evidence of lumbar disc herniation, significant spinal stenosis
or foraminal narrowing.

Progressive facet joint degenerative changes when compared to prior
exam most notable at the L4-5 level.

## 2017-02-07 ENCOUNTER — Ambulatory Visit: Payer: BLUE CROSS/BLUE SHIELD | Admitting: Surgery

## 2017-02-07 ENCOUNTER — Encounter (HOSPITAL_COMMUNITY): Payer: Medicare Other
